# Patient Record
Sex: Male | Born: 1955 | Race: Black or African American | Hispanic: No | Marital: Single | Smoking: Never smoker
Health system: Southern US, Community
[De-identification: ages and names within clinical notes are randomized; demographics above are authoritative.]

## PROBLEM LIST (undated history)

## (undated) DIAGNOSIS — L309 Dermatitis, unspecified: Secondary | ICD-10-CM

## (undated) DIAGNOSIS — E119 Type 2 diabetes mellitus without complications: Secondary | ICD-10-CM

## (undated) DIAGNOSIS — E669 Obesity, unspecified: Secondary | ICD-10-CM

## (undated) DIAGNOSIS — G4733 Obstructive sleep apnea (adult) (pediatric): Secondary | ICD-10-CM

## (undated) DIAGNOSIS — I480 Paroxysmal atrial fibrillation: Secondary | ICD-10-CM

## (undated) DIAGNOSIS — I10 Essential (primary) hypertension: Secondary | ICD-10-CM

## (undated) DIAGNOSIS — I4891 Unspecified atrial fibrillation: Secondary | ICD-10-CM

## (undated) DIAGNOSIS — N529 Male erectile dysfunction, unspecified: Secondary | ICD-10-CM

## (undated) DIAGNOSIS — R001 Bradycardia, unspecified: Secondary | ICD-10-CM

## (undated) DIAGNOSIS — R3129 Other microscopic hematuria: Secondary | ICD-10-CM

## (undated) HISTORY — DX: Other microscopic hematuria: R31.29

## (undated) HISTORY — DX: Essential (primary) hypertension: I10

## (undated) HISTORY — DX: Obesity, unspecified: E66.9

## (undated) HISTORY — DX: Dermatitis, unspecified: L30.9

## (undated) HISTORY — DX: Type 2 diabetes mellitus without complications: E11.9

## (undated) HISTORY — DX: Bradycardia, unspecified: R00.1

## (undated) HISTORY — DX: Obstructive sleep apnea (adult) (pediatric): G47.33

## (undated) HISTORY — DX: Male erectile dysfunction, unspecified: N52.9

## (undated) HISTORY — DX: Paroxysmal atrial fibrillation: I48.0

---
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
## 2003-06-15 ENCOUNTER — Encounter: Payer: Self-pay | Admitting: Cardiology

## 2003-06-15 ENCOUNTER — Encounter: Admission: RE | Admit: 2003-06-15 | Discharge: 2003-06-15 | Payer: Self-pay | Admitting: Cardiology

## 2004-10-12 ENCOUNTER — Ambulatory Visit (HOSPITAL_BASED_OUTPATIENT_CLINIC_OR_DEPARTMENT_OTHER): Admission: RE | Admit: 2004-10-12 | Discharge: 2004-10-12 | Payer: Self-pay | Admitting: Internal Medicine

## 2004-11-02 ENCOUNTER — Ambulatory Visit: Payer: Self-pay | Admitting: Internal Medicine

## 2004-11-30 ENCOUNTER — Ambulatory Visit: Payer: Self-pay | Admitting: Internal Medicine

## 2006-03-14 ENCOUNTER — Inpatient Hospital Stay (HOSPITAL_COMMUNITY): Admission: EM | Admit: 2006-03-14 | Discharge: 2006-03-20 | Payer: Self-pay | Admitting: Emergency Medicine

## 2006-03-14 ENCOUNTER — Ambulatory Visit: Payer: Self-pay | Admitting: Internal Medicine

## 2006-03-14 ENCOUNTER — Encounter (INDEPENDENT_AMBULATORY_CARE_PROVIDER_SITE_OTHER): Payer: Self-pay | Admitting: Cardiovascular Disease

## 2006-03-23 ENCOUNTER — Ambulatory Visit: Payer: Self-pay | Admitting: Cardiology

## 2006-03-29 ENCOUNTER — Ambulatory Visit: Payer: Self-pay | Admitting: Cardiology

## 2006-04-05 ENCOUNTER — Ambulatory Visit: Payer: Self-pay | Admitting: Cardiology

## 2006-04-12 ENCOUNTER — Ambulatory Visit: Payer: Self-pay | Admitting: Cardiology

## 2006-04-16 ENCOUNTER — Ambulatory Visit (HOSPITAL_COMMUNITY): Admission: RE | Admit: 2006-04-16 | Discharge: 2006-04-16 | Payer: Self-pay | Admitting: Internal Medicine

## 2006-04-19 ENCOUNTER — Ambulatory Visit: Payer: Self-pay | Admitting: Cardiology

## 2006-04-23 ENCOUNTER — Ambulatory Visit: Payer: Self-pay | Admitting: Internal Medicine

## 2006-04-23 ENCOUNTER — Ambulatory Visit: Payer: Self-pay | Admitting: Cardiovascular Disease

## 2006-05-04 ENCOUNTER — Ambulatory Visit: Payer: Self-pay | Admitting: Internal Medicine

## 2006-05-04 ENCOUNTER — Ambulatory Visit: Payer: Self-pay

## 2006-05-04 ENCOUNTER — Encounter: Payer: Self-pay | Admitting: Internal Medicine

## 2006-05-25 ENCOUNTER — Ambulatory Visit: Payer: Self-pay | Admitting: Cardiology

## 2006-05-25 ENCOUNTER — Encounter: Payer: Self-pay | Admitting: Cardiology

## 2006-05-25 ENCOUNTER — Ambulatory Visit: Payer: Self-pay

## 2006-06-14 ENCOUNTER — Ambulatory Visit (HOSPITAL_COMMUNITY): Admission: RE | Admit: 2006-06-14 | Discharge: 2006-06-14 | Payer: Self-pay | Admitting: Internal Medicine

## 2006-06-14 ENCOUNTER — Ambulatory Visit: Payer: Self-pay | Admitting: Internal Medicine

## 2006-06-15 ENCOUNTER — Emergency Department (HOSPITAL_COMMUNITY): Admission: EM | Admit: 2006-06-15 | Discharge: 2006-06-16 | Payer: Self-pay | Admitting: Emergency Medicine

## 2006-06-21 ENCOUNTER — Ambulatory Visit: Payer: Self-pay

## 2006-06-21 ENCOUNTER — Ambulatory Visit: Payer: Self-pay | Admitting: Cardiology

## 2006-06-25 ENCOUNTER — Emergency Department (HOSPITAL_COMMUNITY): Admission: EM | Admit: 2006-06-25 | Discharge: 2006-06-25 | Payer: Self-pay | Admitting: Emergency Medicine

## 2006-06-26 ENCOUNTER — Ambulatory Visit (HOSPITAL_COMMUNITY): Admission: RE | Admit: 2006-06-26 | Discharge: 2006-06-26 | Payer: Self-pay | Admitting: Internal Medicine

## 2006-07-12 ENCOUNTER — Ambulatory Visit: Payer: Self-pay | Admitting: Internal Medicine

## 2006-07-19 ENCOUNTER — Ambulatory Visit: Payer: Self-pay | Admitting: Cardiology

## 2006-08-21 ENCOUNTER — Ambulatory Visit: Payer: Self-pay | Admitting: Cardiology

## 2006-10-01 ENCOUNTER — Ambulatory Visit: Payer: Self-pay | Admitting: Cardiology

## 2006-10-11 ENCOUNTER — Ambulatory Visit: Payer: Self-pay | Admitting: Internal Medicine

## 2006-11-06 ENCOUNTER — Ambulatory Visit: Payer: Self-pay | Admitting: Internal Medicine

## 2006-11-08 ENCOUNTER — Ambulatory Visit: Payer: Self-pay | Admitting: *Deleted

## 2006-11-29 ENCOUNTER — Ambulatory Visit: Payer: Self-pay | Admitting: Internal Medicine

## 2006-11-29 ENCOUNTER — Ambulatory Visit: Payer: Self-pay | Admitting: Cardiology

## 2006-12-27 ENCOUNTER — Ambulatory Visit: Payer: Self-pay | Admitting: Cardiology

## 2007-01-10 ENCOUNTER — Ambulatory Visit: Payer: Self-pay | Admitting: Cardiology

## 2007-01-24 ENCOUNTER — Ambulatory Visit: Payer: Self-pay | Admitting: Internal Medicine

## 2007-02-19 ENCOUNTER — Ambulatory Visit: Payer: Self-pay | Admitting: Cardiology

## 2007-03-26 ENCOUNTER — Emergency Department (HOSPITAL_COMMUNITY): Admission: EM | Admit: 2007-03-26 | Discharge: 2007-03-26 | Payer: Self-pay | Admitting: Emergency Medicine

## 2007-03-26 ENCOUNTER — Ambulatory Visit: Payer: Self-pay | Admitting: Cardiology

## 2007-03-26 ENCOUNTER — Ambulatory Visit: Payer: Self-pay | Admitting: Internal Medicine

## 2007-03-27 ENCOUNTER — Emergency Department (HOSPITAL_COMMUNITY): Admission: EM | Admit: 2007-03-27 | Discharge: 2007-03-27 | Payer: Self-pay | Admitting: Family Medicine

## 2007-03-29 ENCOUNTER — Ambulatory Visit: Payer: Self-pay | Admitting: Cardiology

## 2007-03-29 LAB — CONVERTED CEMR LAB
Basophils Relative: 0.7 % (ref 0.0–1.0)
Eosinophils Relative: 0.7 % (ref 0.0–5.0)
HCT: 39.5 % (ref 39.0–52.0)
Hemoglobin: 13 g/dL (ref 13.0–17.0)
Lymphocytes Relative: 20.6 % (ref 12.0–46.0)
Monocytes Absolute: 1.1 10*3/uL — ABNORMAL HIGH (ref 0.2–0.7)
Neutro Abs: 9.7 10*3/uL — ABNORMAL HIGH (ref 1.4–7.7)
Neutrophils Relative %: 70 % (ref 43.0–77.0)
WBC: 13.8 10*3/uL — ABNORMAL HIGH (ref 4.5–10.5)

## 2007-04-04 ENCOUNTER — Emergency Department (HOSPITAL_COMMUNITY): Admission: EM | Admit: 2007-04-04 | Discharge: 2007-04-04 | Payer: Self-pay | Admitting: Emergency Medicine

## 2007-04-17 ENCOUNTER — Ambulatory Visit: Payer: Self-pay | Admitting: Internal Medicine

## 2007-04-17 LAB — CONVERTED CEMR LAB
AST: 22 units/L (ref 0–37)
Hgb A1c MFr Bld: 6.1 % — ABNORMAL HIGH (ref 4.6–6.0)
PSA: 0.79 ng/mL (ref 0.10–4.00)
VLDL: 39 mg/dL (ref 0–40)

## 2007-05-09 ENCOUNTER — Ambulatory Visit: Payer: Self-pay | Admitting: Internal Medicine

## 2007-05-21 ENCOUNTER — Ambulatory Visit: Payer: Self-pay | Admitting: Internal Medicine

## 2007-05-23 ENCOUNTER — Ambulatory Visit: Payer: Self-pay | Admitting: Cardiology

## 2007-07-04 ENCOUNTER — Ambulatory Visit: Payer: Self-pay | Admitting: Internal Medicine

## 2007-07-31 ENCOUNTER — Ambulatory Visit: Payer: Self-pay | Admitting: Internal Medicine

## 2007-10-02 DIAGNOSIS — G4733 Obstructive sleep apnea (adult) (pediatric): Secondary | ICD-10-CM | POA: Insufficient documentation

## 2007-10-02 DIAGNOSIS — I10 Essential (primary) hypertension: Secondary | ICD-10-CM | POA: Insufficient documentation

## 2007-10-02 DIAGNOSIS — I4891 Unspecified atrial fibrillation: Secondary | ICD-10-CM | POA: Insufficient documentation

## 2007-11-20 ENCOUNTER — Ambulatory Visit: Payer: Self-pay | Admitting: Cardiology

## 2007-12-11 ENCOUNTER — Ambulatory Visit: Payer: Self-pay | Admitting: Cardiology

## 2007-12-12 ENCOUNTER — Encounter: Payer: Self-pay | Admitting: Internal Medicine

## 2008-01-02 ENCOUNTER — Encounter: Payer: Self-pay | Admitting: Internal Medicine

## 2008-02-11 ENCOUNTER — Ambulatory Visit: Payer: Self-pay | Admitting: Cardiology

## 2008-02-24 ENCOUNTER — Ambulatory Visit: Payer: Self-pay | Admitting: Internal Medicine

## 2008-02-24 DIAGNOSIS — H9190 Unspecified hearing loss, unspecified ear: Secondary | ICD-10-CM | POA: Insufficient documentation

## 2008-02-24 DIAGNOSIS — L259 Unspecified contact dermatitis, unspecified cause: Secondary | ICD-10-CM | POA: Insufficient documentation

## 2008-03-03 ENCOUNTER — Encounter: Payer: Self-pay | Admitting: Internal Medicine

## 2008-05-26 ENCOUNTER — Ambulatory Visit: Payer: Self-pay | Admitting: Internal Medicine

## 2008-05-26 LAB — CONVERTED CEMR LAB
ALT: 22 units/L (ref 0–53)
AST: 23 units/L (ref 0–37)
Bilirubin, Direct: 0.1 mg/dL (ref 0.0–0.3)
CO2: 27 meq/L (ref 19–32)
Calcium: 8.7 mg/dL (ref 8.4–10.5)
Chloride: 105 meq/L (ref 96–112)
GFR calc non Af Amer: 74 mL/min
LDL Cholesterol: 104 mg/dL — ABNORMAL HIGH (ref 0–99)
Sodium: 139 meq/L (ref 135–145)
TSH: 1.72 microintl units/mL (ref 0.35–5.50)
Total Bilirubin: 0.7 mg/dL (ref 0.3–1.2)
Total CHOL/HDL Ratio: 5

## 2008-06-02 ENCOUNTER — Ambulatory Visit: Payer: Self-pay | Admitting: Internal Medicine

## 2008-06-02 DIAGNOSIS — R7309 Other abnormal glucose: Secondary | ICD-10-CM

## 2008-09-08 ENCOUNTER — Encounter: Payer: Self-pay | Admitting: Internal Medicine

## 2008-10-15 ENCOUNTER — Ambulatory Visit: Payer: Self-pay | Admitting: Internal Medicine

## 2008-10-19 ENCOUNTER — Inpatient Hospital Stay: Payer: Self-pay | Admitting: Internal Medicine

## 2008-10-19 ENCOUNTER — Ambulatory Visit: Payer: Self-pay | Admitting: Cardiology

## 2008-10-26 ENCOUNTER — Ambulatory Visit: Payer: Self-pay | Admitting: Cardiology

## 2008-10-27 ENCOUNTER — Ambulatory Visit: Payer: Self-pay | Admitting: Cardiology

## 2008-10-29 ENCOUNTER — Ambulatory Visit: Payer: Self-pay | Admitting: Diagnostic Radiology

## 2008-10-29 ENCOUNTER — Ambulatory Visit (HOSPITAL_BASED_OUTPATIENT_CLINIC_OR_DEPARTMENT_OTHER): Admission: RE | Admit: 2008-10-29 | Discharge: 2008-10-29 | Payer: Self-pay | Admitting: Internal Medicine

## 2008-10-29 ENCOUNTER — Ambulatory Visit: Payer: Self-pay | Admitting: Internal Medicine

## 2008-10-29 ENCOUNTER — Telehealth (INDEPENDENT_AMBULATORY_CARE_PROVIDER_SITE_OTHER): Payer: Self-pay | Admitting: *Deleted

## 2008-10-29 DIAGNOSIS — R3129 Other microscopic hematuria: Secondary | ICD-10-CM | POA: Insufficient documentation

## 2008-10-29 DIAGNOSIS — R1012 Left upper quadrant pain: Secondary | ICD-10-CM

## 2008-10-29 DIAGNOSIS — R0789 Other chest pain: Secondary | ICD-10-CM

## 2008-10-29 LAB — CONVERTED CEMR LAB
Bacteria, UA: NEGATIVE
Bilirubin Urine: NEGATIVE
Bilirubin Urine: NEGATIVE
Crystals: NEGATIVE
Glucose, Urine, Semiquant: NEGATIVE
Ketones, ur: NEGATIVE mg/dL
Ketones, urine, test strip: NEGATIVE
Leukocytes, UA: NEGATIVE
Mucus, UA: NEGATIVE
Nitrite: NEGATIVE
Nitrite: NEGATIVE
Protein, U semiquant: 30
Specific Gravity, Urine: 1.015
Specific Gravity, Urine: 1.015 (ref 1.000–1.03)
Total Protein, Urine: NEGATIVE mg/dL
Urine Glucose: NEGATIVE mg/dL
Urobilinogen, UA: 0.2
Urobilinogen, UA: 0.2 (ref 0.0–1.0)
WBC Urine, dipstick: NEGATIVE
pH: 6
pH: 7 (ref 5.0–8.0)

## 2008-10-30 ENCOUNTER — Ambulatory Visit: Payer: Self-pay | Admitting: Cardiology

## 2008-11-02 ENCOUNTER — Telehealth: Payer: Self-pay | Admitting: Internal Medicine

## 2008-11-12 ENCOUNTER — Ambulatory Visit: Payer: Self-pay | Admitting: Cardiology

## 2008-11-13 ENCOUNTER — Ambulatory Visit: Payer: Self-pay | Admitting: Internal Medicine

## 2008-11-25 ENCOUNTER — Encounter: Payer: Self-pay | Admitting: Pulmonary Disease

## 2008-12-09 ENCOUNTER — Encounter: Payer: Self-pay | Admitting: Cardiology

## 2008-12-09 ENCOUNTER — Ambulatory Visit: Payer: Self-pay | Admitting: Internal Medicine

## 2008-12-09 LAB — CONVERTED CEMR LAB
ALT: 23 units/L (ref 0–53)
Alkaline Phosphatase: 44 units/L (ref 39–117)
Bilirubin, Direct: 0.1 mg/dL (ref 0.0–0.3)
Cholesterol: 189 mg/dL (ref 0–200)
Indirect Bilirubin: 0.3 mg/dL (ref 0.0–0.9)
Total Protein: 6.9 g/dL (ref 6.0–8.3)
Triglycerides: 129 mg/dL (ref <150)

## 2008-12-16 ENCOUNTER — Ambulatory Visit: Payer: Self-pay | Admitting: Internal Medicine

## 2008-12-30 ENCOUNTER — Ambulatory Visit: Payer: Self-pay | Admitting: Internal Medicine

## 2009-02-03 ENCOUNTER — Ambulatory Visit: Payer: Self-pay | Admitting: Internal Medicine

## 2009-03-09 ENCOUNTER — Ambulatory Visit: Payer: Self-pay | Admitting: Cardiovascular Disease

## 2009-03-23 ENCOUNTER — Encounter: Payer: Self-pay | Admitting: *Deleted

## 2009-04-24 ENCOUNTER — Emergency Department: Payer: Self-pay | Admitting: Emergency Medicine

## 2009-04-28 ENCOUNTER — Encounter: Payer: Self-pay | Admitting: Cardiology

## 2009-04-28 ENCOUNTER — Ambulatory Visit: Payer: Self-pay | Admitting: Cardiology

## 2009-04-28 ENCOUNTER — Encounter: Payer: Self-pay | Admitting: *Deleted

## 2009-04-28 DIAGNOSIS — E785 Hyperlipidemia, unspecified: Secondary | ICD-10-CM

## 2009-05-26 ENCOUNTER — Ambulatory Visit: Payer: Self-pay | Admitting: Cardiology

## 2009-06-02 LAB — CONVERTED CEMR LAB
ALT: 18 units/L (ref 0–53)
AST: 19 units/L (ref 0–37)
Albumin: 3.9 g/dL (ref 3.5–5.2)
Bilirubin, Direct: 0.1 mg/dL (ref 0.0–0.3)
HDL: 43 mg/dL (ref >39)
INR: 2.8 — ABNORMAL HIGH (ref 0.0–1.5)
Total Bilirubin: 0.4 mg/dL (ref 0.3–1.2)
Total CHOL/HDL Ratio: 3.3
VLDL: 27 mg/dL (ref 0–40)

## 2009-06-07 ENCOUNTER — Encounter: Payer: Self-pay | Admitting: *Deleted

## 2009-06-15 ENCOUNTER — Telehealth: Payer: Self-pay | Admitting: Cardiology

## 2009-07-19 ENCOUNTER — Encounter (INDEPENDENT_AMBULATORY_CARE_PROVIDER_SITE_OTHER): Payer: Self-pay | Admitting: *Deleted

## 2009-07-29 ENCOUNTER — Telehealth: Payer: Self-pay | Admitting: Internal Medicine

## 2009-08-02 ENCOUNTER — Telehealth: Payer: Self-pay | Admitting: Internal Medicine

## 2009-08-05 ENCOUNTER — Ambulatory Visit: Payer: Self-pay | Admitting: Cardiology

## 2009-08-05 LAB — CONVERTED CEMR LAB: POC INR: 3.1

## 2009-08-10 ENCOUNTER — Ambulatory Visit: Payer: Self-pay | Admitting: Internal Medicine

## 2009-08-10 LAB — CONVERTED CEMR LAB
CO2: 30 meq/L (ref 19–32)
Calcium: 9.1 mg/dL (ref 8.4–10.5)
Chloride: 106 meq/L (ref 96–112)
Creatinine, Ser: 1.1 mg/dL (ref 0.4–1.5)
Hgb A1c MFr Bld: 6.1 % (ref 4.6–6.5)
Sodium: 142 meq/L (ref 135–145)

## 2009-08-12 ENCOUNTER — Ambulatory Visit: Payer: Self-pay | Admitting: Internal Medicine

## 2009-08-30 ENCOUNTER — Telehealth: Payer: Self-pay | Admitting: Cardiology

## 2009-09-06 ENCOUNTER — Telehealth (INDEPENDENT_AMBULATORY_CARE_PROVIDER_SITE_OTHER): Payer: Self-pay | Admitting: *Deleted

## 2009-09-22 ENCOUNTER — Encounter (INDEPENDENT_AMBULATORY_CARE_PROVIDER_SITE_OTHER): Payer: Self-pay | Admitting: *Deleted

## 2009-11-30 ENCOUNTER — Ambulatory Visit: Payer: Self-pay | Admitting: Cardiology

## 2009-11-30 LAB — CONVERTED CEMR LAB: POC INR: 1.9

## 2009-12-01 ENCOUNTER — Telehealth: Payer: Self-pay | Admitting: Internal Medicine

## 2009-12-03 ENCOUNTER — Encounter (INDEPENDENT_AMBULATORY_CARE_PROVIDER_SITE_OTHER): Payer: Self-pay | Admitting: *Deleted

## 2009-12-10 ENCOUNTER — Encounter (INDEPENDENT_AMBULATORY_CARE_PROVIDER_SITE_OTHER): Payer: Self-pay | Admitting: *Deleted

## 2009-12-10 ENCOUNTER — Ambulatory Visit: Payer: Self-pay | Admitting: Gastroenterology

## 2010-01-13 ENCOUNTER — Telehealth: Payer: Self-pay | Admitting: Cardiology

## 2010-01-18 ENCOUNTER — Telehealth: Payer: Self-pay | Admitting: Gastroenterology

## 2010-01-31 ENCOUNTER — Telehealth: Payer: Self-pay | Admitting: Internal Medicine

## 2010-02-18 IMAGING — CR DG CHEST 1V PORT
1 series · 1 of 1 positions shown · non-contrast
Comparison: none

REASON FOR EXAM: Chest Pain
COMMENTS:

PROCEDURE:     DXR - DXR PORTABLE CHEST SINGLE VIEW  - April 24, 2009  [DATE]
RESULT:     No acute cardiopulmonary disease identified. Cardiomegaly is
present. Chest is stable from 10-19-2008.

[view not recorded]
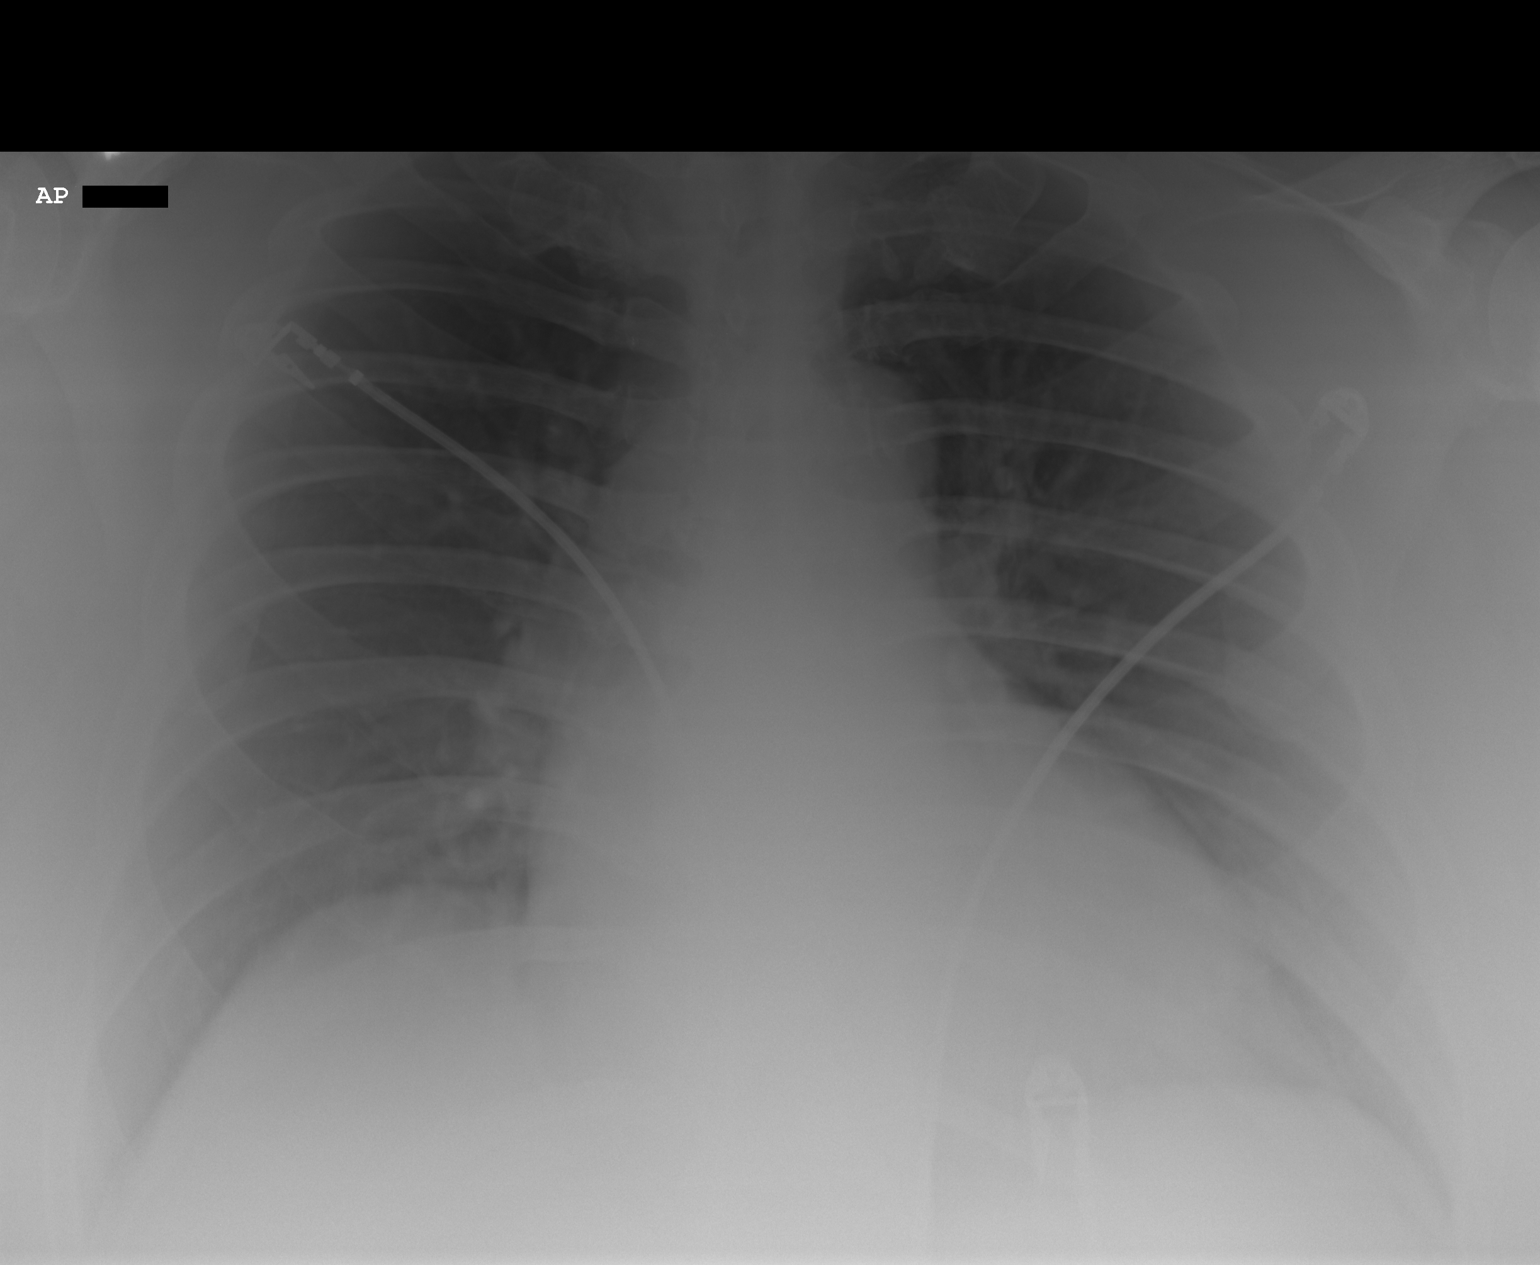

[1 of 1 positions shown; findings below may reference images not displayed]

IMPRESSION: 1. Stable chest from 10-19-2008 with stable cardiomegaly.

## 2010-03-15 ENCOUNTER — Telehealth: Payer: Self-pay | Admitting: Cardiovascular Disease

## 2010-03-17 ENCOUNTER — Ambulatory Visit: Payer: Self-pay | Admitting: Cardiovascular Disease

## 2010-03-17 LAB — CONVERTED CEMR LAB: POC INR: 1.7

## 2010-03-25 ENCOUNTER — Ambulatory Visit: Payer: Self-pay | Admitting: Cardiovascular Disease

## 2010-04-13 ENCOUNTER — Telehealth: Payer: Self-pay | Admitting: Cardiovascular Disease

## 2010-04-16 ENCOUNTER — Ambulatory Visit: Payer: Self-pay | Admitting: Internal Medicine

## 2010-04-16 ENCOUNTER — Inpatient Hospital Stay: Payer: Self-pay | Admitting: Internal Medicine

## 2010-04-18 ENCOUNTER — Encounter: Payer: Self-pay | Admitting: Internal Medicine

## 2010-04-20 ENCOUNTER — Ambulatory Visit: Payer: Self-pay | Admitting: Cardiovascular Disease

## 2010-04-21 ENCOUNTER — Telehealth: Payer: Self-pay | Admitting: Internal Medicine

## 2010-04-22 ENCOUNTER — Ambulatory Visit (HOSPITAL_COMMUNITY): Admission: RE | Admit: 2010-04-22 | Discharge: 2010-04-22 | Payer: Self-pay | Admitting: Internal Medicine

## 2010-04-22 ENCOUNTER — Ambulatory Visit: Payer: Self-pay | Admitting: Cardiology

## 2010-04-28 ENCOUNTER — Telehealth: Payer: Self-pay | Admitting: Cardiovascular Disease

## 2010-04-29 ENCOUNTER — Ambulatory Visit: Payer: Self-pay | Admitting: Internal Medicine

## 2010-04-29 ENCOUNTER — Encounter: Payer: Self-pay | Admitting: Cardiovascular Disease

## 2010-05-04 ENCOUNTER — Encounter: Payer: Self-pay | Admitting: Cardiovascular Disease

## 2010-05-04 ENCOUNTER — Telehealth: Payer: Self-pay | Admitting: Cardiovascular Disease

## 2010-05-18 ENCOUNTER — Encounter (INDEPENDENT_AMBULATORY_CARE_PROVIDER_SITE_OTHER): Payer: Self-pay | Admitting: Pharmacist

## 2010-05-18 ENCOUNTER — Telehealth (INDEPENDENT_AMBULATORY_CARE_PROVIDER_SITE_OTHER): Payer: Self-pay

## 2010-06-01 ENCOUNTER — Telehealth (INDEPENDENT_AMBULATORY_CARE_PROVIDER_SITE_OTHER): Payer: Self-pay

## 2010-06-15 ENCOUNTER — Encounter: Payer: Self-pay | Admitting: Cardiovascular Disease

## 2010-06-15 ENCOUNTER — Ambulatory Visit: Payer: Self-pay | Admitting: Internal Medicine

## 2010-06-15 LAB — CONVERTED CEMR LAB
BUN: 11 mg/dL (ref 6–23)
CO2: 23 meq/L (ref 19–32)
Glucose, Bld: 95 mg/dL (ref 70–99)
Potassium: 4 meq/L (ref 3.5–5.3)
Sodium: 140 meq/L (ref 135–145)

## 2010-06-16 ENCOUNTER — Encounter: Payer: Self-pay | Admitting: Cardiovascular Disease

## 2010-06-16 ENCOUNTER — Encounter: Payer: Self-pay | Admitting: Internal Medicine

## 2010-06-17 ENCOUNTER — Encounter: Payer: Self-pay | Admitting: Internal Medicine

## 2010-07-03 ENCOUNTER — Emergency Department: Payer: Self-pay | Admitting: Emergency Medicine

## 2010-08-15 ENCOUNTER — Telehealth: Payer: Self-pay | Admitting: Cardiology

## 2010-08-31 ENCOUNTER — Telehealth: Payer: Self-pay | Admitting: Internal Medicine

## 2010-09-19 ENCOUNTER — Telehealth: Payer: Self-pay | Admitting: Internal Medicine

## 2010-10-12 ENCOUNTER — Telehealth: Payer: Self-pay | Admitting: Cardiovascular Disease

## 2010-10-13 ENCOUNTER — Ambulatory Visit: Payer: Self-pay | Admitting: Cardiovascular Disease

## 2010-10-13 LAB — CONVERTED CEMR LAB: POC INR: 1.9

## 2010-11-01 ENCOUNTER — Telehealth: Payer: Self-pay | Admitting: Cardiology

## 2010-11-01 ENCOUNTER — Encounter: Payer: Self-pay | Admitting: Cardiovascular Disease

## 2010-11-01 ENCOUNTER — Ambulatory Visit
Admission: RE | Admit: 2010-11-01 | Discharge: 2010-11-01 | Payer: Self-pay | Source: Home / Self Care | Attending: Cardiovascular Disease | Admitting: Cardiovascular Disease

## 2010-11-09 ENCOUNTER — Ambulatory Visit: Admit: 2010-11-09 | Payer: Self-pay

## 2010-11-13 ENCOUNTER — Encounter: Payer: Self-pay | Admitting: Internal Medicine

## 2010-11-22 NOTE — Letter (Signed)
Summary: Anticoagulation Modification Letter  [facility]  [address] [place], [place] [address]   Phone: [phone]  Fax: [phone]    December 10, 2009  Re:    Steven Beltran DOB:    06-10-1956 MRN:    [id number]    Dear Dr. Graciela Husbands,   We have scheduled the above patient for an endoscopic procedure. Our records show that  he/she is on anticoagulation therapy. Please advise as to how long the patient may come off their therapy of coumadin prior to the scheduled procedure(s) on 02/04/10.   Please fax back/or route the completed form to [place] at [phone].  Thank you for your help with this matter.  Sincerely,  Christie Nottingham CMA Duncan Dull)   Physician Recommendation:  Hold Plavix 7 days prior ________________  Hold Coumadin 5 days prior ____________  Other ______________________________     Appended Document: Anticoagulation Modification Letter yes  Appended Document: Anticoagulation Modification Letter Left a mesage stating Dr. Graciela Husbands said it was ok for pt to come off coumdain 5 days before his procedure and if he has any questions to call back and ask to speak to me.

## 2010-11-22 NOTE — Assessment & Plan Note (Signed)
Summary: TB Skin Test Recheck  Nurse Visit   Primary Care Steven Beltran:  [name] DO  CC:  TB skin test recheck.  History of Present Illness:  The patient presented after  48 hours to check the injection site for positive or negative reaction  Injection site examination No firm bump forms at the test site. Slight reddish appearance  and diameter was smaller than 5mm  Assesment & Plan Negative TB skin test. Patient was counseled to call is expeience any irritation of site   [name] [facility]  June 17, 2010 10:51 AM    Allergies: No Known Drug Allergies

## 2010-11-22 NOTE — Letter (Signed)
Summary: [facility] Instructions  [facility]  [address] [place], [place] [address]   Phone: [phone]  Fax: [phone]       Steven Beltran    Sep 15, 1956    MRN: [id number]        Procedure Day /Date: Tuesday April 5th, 2011     Arrival Time: 7:30am     Procedure Time: 8:30am     Location of Procedure:                     _x_  [facility] ( Outpatient Registration)                        PREPARATION FOR COLONOSCOPY WITH MOVIPREP   Starting 5 days prior to your procedure 01/20/10  do not eat nuts, seeds, popcorn, corn, beans, peas,  salads, or any raw vegetables.  Do not take any fiber supplements (e.g. Metamucil, Citrucel, and Benefiber).  THE DAY BEFORE YOUR PROCEDURE         DATE:  01/24/10   DAY:  Monday   1.  Drink clear liquids the entire day-NO SOLID FOOD  2.  Do not drink anything colored red or purple.  Avoid juices with pulp.  No orange juice.  3.  Drink at least 64 oz. (8 glasses) of fluid/clear liquids during the day to prevent dehydration and help the prep work efficiently.  CLEAR LIQUIDS INCLUDE: Water Jello Ice Popsicles Tea (sugar ok, no milk/cream) Powdered fruit flavored drinks Coffee (sugar ok, no milk/cream) Gatorade Juice: apple, white grape, white cranberry  Lemonade Clear bullion, consomm, broth Carbonated beverages (any kind) Strained chicken noodle soup Hard Candy                             4.  In the morning, mix first dose of MoviPrep solution:    Empty 1 Pouch A and 1 Pouch B into the disposable container    Add lukewarm drinking water to the top line of the container. Mix to dissolve    Refrigerate (mixed solution should be used within 24 hrs)  5.  Begin drinking the prep at 5:00 p.m. The MoviPrep container is divided by 4 marks.   Every 15 minutes drink the solution down to the next mark (approximately 8 oz) until the full liter is complete.   6.  Follow completed prep with 16 oz of clear liquid of  your choice (Nothing red or purple).  Continue to drink clear liquids until bedtime.  7.  Before going to bed, mix second dose of MoviPrep solution:    Empty 1 Pouch A and 1 Pouch B into the disposable container    Add lukewarm drinking water to the top line of the container. Mix to dissolve    Refrigerate  THE DAY OF YOUR PROCEDURE      DATE:  01/25/10  DAY:  Tuesday  Beginning at  3:30a.m. (5 hours before procedure):         1. Every 15 minutes, drink the solution down to the next mark (approx 8 oz) until the full liter is complete.  2. Follow completed prep with 16 oz. of clear liquid of your choice.    3. You may drink clear liquids until  4:30am  (4 HOURS BEFORE PROCEDURE).   MEDICATION INSTRUCTIONS  Unless otherwise instructed, you should take regular prescription medications with a small sip of  water   as early as possible the morning of your procedure.  Diabetic patients - see separate instructions.  Stop taking Plavix or Aggrenox on  _  _  (7 days before procedure).     Stop taking Coumadin on  _ _  (5 days before procedure).  Additional medication instructions: You will be contaced by our office prior to your procedure for directions on holding your Coumadin/Warfarin.  If you do not hear from our office 1 week prior to your scheduled procedure, please call [phone] to discuss.          OTHER INSTRUCTIONS  You will need a responsible adult at least 56 years of age to accompany you and drive you home.   This person must remain in the waiting room during your procedure.  Wear loose fitting clothing that is easily removed.  Leave jewelry and other valuables at home.  However, you may wish to bring a book to read or  an iPod/MP3 player to listen to music as you wait for your procedure to start.  Remove all body piercing jewelry and leave at home.  Total time from sign-in until discharge is approximately 2-3 hours.  You should go home directly after your  procedure and rest.  You can resume normal activities the  day after your procedure.  The day of your procedure you should not:   Drive   Make legal decisions   Operate machinery   Drink alcohol   Return to work  You will receive specific instructions about eating, activities and medications before you leave.    The above instructions have been reviewed and explained to me by   Marchelle Folks.    I fully understand and can verbalize these instructions _____________________________ Date _________

## 2010-11-22 NOTE — Letter (Signed)
Summary: New Patient letter  [facility]  [address] [place], [place] [address]   Phone: [phone]  Fax: [phone]       12/03/2009 MRN: [id number]  LACY TAGLIERI [address] [place], [place]  [address]  Dear Mr. WINKELS,  Welcome to the Gastroenterology Division at [facility].    You are scheduled to see Dr.  [name] on 12-10-09 at 8:45AM on the 3rd floor at [facility], [address]  We ask that you try to arrive at our office 15 minutes prior to your appointment time to allow for check-in.  We would like you to complete the enclosed self-administered evaluation form prior to your visit and bring it with you on the day of your appointment.  We will review it with you.  Also, please bring a complete list of all your medications or, if you prefer, bring the medication bottles and we will list them.  Please bring your insurance card so that we may make a copy of it.  If your insurance requires a referral to see a specialist, please bring your referral form from your primary care physician.  Co-payments are due at the time of your visit and may be paid by cash, check or credit card.     Your office visit will consist of a consult with your physician (includes a physical exam), any laboratory testing he/she may order, scheduling of any necessary diagnostic testing (e.g. x-ray, ultrasound, CT-scan), and scheduling of a procedure (e.g. Endoscopy, Colonoscopy) if required.  Please allow enough time on your schedule to allow for any/all of these possibilities.    If you cannot keep your appointment, please call [phone] to cancel or reschedule prior to your appointment date.  This allows [place] the opportunity to schedule an appointment for another patient in need of care.  If you do not cancel or reschedule by 5 p.m. the business day prior to your appointment date, you will be charged a $50.00 late cancellation/no-show fee.    Thank you for choosing [facility] for your medical needs.  We appreciate the opportunity to care for you.  Please visit [place] at our website  to learn more about our practice.                     Sincerely,                                                             The Gastroenterology Division

## 2010-11-22 NOTE — Letter (Signed)
Summary: Cardiac Catheterization Instructions- Main Lab  [facility] at [address]   [place], [place] [address]   Phone: [phone]  Fax: [phone]     04/18/2010 MRN: [id number]  Steven Beltran [address] [place], [place]  [address]  Dear Mr. Steven, Beltran are scheduled for Cardiac Catheterization on April 22, 2010 at 7:30 with Dr.Bensimhon.  Please arrive at the [facility] at 5:30       a.m.on the day of your procedure.  1. DIET     x Nothing to eat or drink after midnight except your medications with a sip of water.  2. Come to the [facility] office on April 20, 2010 @ 8:30 for lab work.    3. MAKE SURE YOU TAKE YOUR ASPIRIN.  4. __x___ DO NOT TAKE these medications before your procedure:    Glucophage    Coumadin     __x__ YOU MAY TAKE ALL of your remaining medications with a small amount of water.  5. Plan for one night stay - bring personal belongings (i.e. toothpaste, toothbrush, etc.)  6. Bring a current list of your medications and current insurance cards.  7. Must have a responsible person to drive you home.   8. Someone must be with you for the first 24 hours after you arrive home.  9. Please wear clothes that are easy to get on and off and wear slip-on shoes.  *Special note: Every effort is made to have your procedure done on time.  Occasionally there are emergencies that present themselves at the hospital that may cause delays.  Please be patient if a delay does occur.  If you have any questions after you get home, please call the office at the number listed above.  Benedict Needy, RN

## 2010-11-22 NOTE — Procedures (Signed)
Summary: Prep/[facility]  Prep/[facility]   Imported By: Lester [place] 12/15/2009 09:11:28  _____________________________________________________________________  External Attachment:    Type:   Image     Comment:   External Document

## 2010-11-22 NOTE — Letter (Signed)
Summary: Custom - Delinquent Coumadin 1  [facility] at [address]   [place], [place] [address]   Phone: [phone]  Fax: [phone]     May 18, 2010 MRN: [id number]   DICKEY CAAMANO [address] [place], [place]  [address]   Dear Mr. KRONICK,  This letter is being sent to you as a reminder that it is necessary for you to get your INR/PT checked regularly so that we can optimize your care.  Our records indicate that you were scheduled to have a test done recently.  As of today, we have not received the results of this test.  It is very important that you have your INR checked.  Please call our office at the number listed above to schedule an appointment at your earliest convenience.    If you have recently had your protime checked or have discontinued this medication, please contact our office at the above phone number to clarify this issue.  Thank you for this prompt attention to this important health care matter.  Sincerely,   [facility] Cardiovascular [facility] Reduction [facility] Team

## 2010-11-22 NOTE — Medication Information (Signed)
Summary: Coumadin Clinic  Anticoagulant Therapy  Managed by: Cloyde Reams, RN, BSN Referring MD: Sherryl Manges MD PCP: Dondra Spry DO Supervising MD: Mariah Milling Indication 1: Atrial Fibrillation (ICD-427.31) Lab Used: [facility] Site: [place] [facility] 24.4 INR POC 2.18 INR RANGE 2 - 3  Dietary changes: no    Health status changes: no    Bleeding/hemorrhagic complications: no    Recent/future hospitalizations: no    Any changes in medication regimen? no    Recent/future dental: no  Any missed doses?: yes     Details: Missed 1 dosage 06/07/10.   Is patient compliant with meds? yes       Allergies: No Known Drug Allergies  Anticoagulation Management History:      His anticoagulation is being managed by telephone today.  Positive risk factors for bleeding include presence of serious comorbidities.  Negative risk factors for bleeding include an age less than 23 years old.  The bleeding index is 'intermediate risk'.  Positive CHADS2 values include History of HTN and History of Diabetes.  Negative CHADS2 values include Age > 58 years old.  The start date was 03/20/2006.  His last INR was 2.18.  Prothrombin time is 24.4.  Anticoagulation responsible provider: gollan.  INR POC: 2.18.  Exp: 05/2011.    Anticoagulation Management Assessment/Plan:      The patient's current anticoagulation dose is Warfarin sodium 10 mg tabs: 1 by mouth once daily as directed by coumadin clinic.  The target INR is 2 - 3.  The next INR is due 07/13/2010.  Anticoagulation instructions were given to patient.  Results were reviewed/authorized by Cloyde Reams, RN, BSN.  He was notified by Cloyde Reams RN.         Prior Anticoagulation Instructions: INR 1.7   Take 2 tablets today, then resume on same dosage 10mg  daily.  Recheck in 1 week at MD visit.    Current Anticoagulation Instructions: INR 2.18  Attempted to call pt with results.  LMOM TCB. Cloyde Reams RN  June 16, 2010 3:44 PM  Spoke with pt advised to continue on same dosage 1 tablet daily.  Recheck in 4 weeks.

## 2010-11-22 NOTE — Medication Information (Signed)
Summary: CC   Anticoagulant Therapy  Managed by: Charlena Cross, RN, BSN Referring MD: Sherryl Manges MD PCP: Dondra Spry DO Supervising MD: Mariah Milling Indication 1: Atrial Fibrillation (ICD-427.31) Lab Used: [facility] Site:  INR POC 1.9 INR RANGE 2 - 3  Dietary changes: no    Health status changes: no    Bleeding/hemorrhagic complications: no    Recent/future hospitalizations: no    Any changes in medication regimen? no    Recent/future dental: no  Any missed doses?: no       Is patient compliant with meds? yes       Allergies: No Known Drug Allergies  Anticoagulation Management History:      The patient is taking warfarin and comes in today for a routine follow up visit.  Negative risk factors for bleeding include an age less than 83 years old.  The bleeding index is 'low risk'.  Positive CHADS2 values include History of HTN.  Negative CHADS2 values include Age > 94 years old.  The start date was 03/20/2006.  His last INR was 2.8.  Anticoagulation responsible provider: Kathleena Freeman.  INR POC: 1.9.    Anticoagulation Management Assessment/Plan:      The patient's current anticoagulation dose is Warfarin sodium 10 mg tabs: 1 by mouth once daily as directed by coumadin clinic.  The target INR is 2 - 3.  The next INR is due 12/28/2009.  Anticoagulation instructions were given to patient.  Results were reviewed/authorized by Charlena Cross, RN, BSN.  He was notified by Charlena Cross, RN, BSN.         Prior Anticoagulation Instructions: The patient is to continue with the same dose of coumadin.  This dosage includes: coumadin 10 mg daily  Current Anticoagulation Instructions: coumadin 15 mg today then resume 10 mg daily

## 2010-11-22 NOTE — Medication Information (Signed)
Summary: CCR/AMD   Anticoagulant Therapy  Managed by: Cloyde Reams, RN, BSN Referring MD: Sherryl Manges MD PCP: Dondra Spry DO Supervising MD: Mariah Milling Indication 1: Atrial Fibrillation (ICD-427.31) Lab Used: [facility] Site: [place] INR POC 1.7 INR RANGE 2 - 3    Bleeding/hemorrhagic complications: no     Any changes in medication regimen? no     Any missed doses?: yes     Details: may have missed a couple of doses, unsure when.    Allergies: No Known Drug Allergies  Anticoagulation Management History:      The patient is taking warfarin and comes in today for a routine follow up visit.  Positive risk factors for bleeding include presence of serious comorbidities.  Negative risk factors for bleeding include an age less than 40 years old.  The bleeding index is 'intermediate risk'.  Positive CHADS2 values include History of HTN and History of Diabetes.  Negative CHADS2 values include Age > 42 years old.  The start date was 03/20/2006.  His last INR was 2.8.  Anticoagulation responsible provider: gollan.  INR POC: 1.7.  Cuvette Lot#: [id number].  Exp: 05/2011.    Anticoagulation Management Assessment/Plan:      The patient's current anticoagulation dose is Warfarin sodium 10 mg tabs: 1 by mouth once daily as directed by coumadin clinic.  The target INR is 2 - 3.  The next INR is due 03/25/2010.  Anticoagulation instructions were given to patient.  Results were reviewed/authorized by Cloyde Reams, RN, BSN.  He was notified by Cloyde Reams, RN, BSN.         Prior Anticoagulation Instructions: coumadin 15 mg today then resume 10 mg daily  Current Anticoagulation Instructions: INR 1.7   Take 2 tablets today, then resume on same dosage 10mg  daily.  Recheck in 1 week at MD visit.   Prescriptions: WARFARIN SODIUM 10 MG TABS (WARFARIN SODIUM) 1 by mouth once daily as directed by coumadin clinic  #35 x 1   Entered by:   Hardin Negus,  RMA   Authorized by:   Dossie Arbour MD   Signed by:   Hardin Negus, RMA on 03/17/2010   Method used:   Electronically to        [id number] (retail)       [address]       [place], [place]  [address]       Ph: [phone]       Fax: [phone]   RxID:   [id number]

## 2010-11-22 NOTE — Assessment & Plan Note (Signed)
Summary: SCREEN FOR COLON/YF   History of Present Illness Visit Type: Initial Consult Primary GI MD: Elie Goody MD [facility] Primary Provider: Dondra Spry DO Requesting Provider: Dondra Spry DO Chief Complaint: Screening for colonoscopy  on Warfarin History of Present Illness:   Steven Beltran is a 55 year old male referred for colorectal cancer screening. He is paroxysmal atrial fibrillation and is maintained on Coumadin, obstructive sleep apnea using CPAP hypertension, diabetes mellitus, and obesity. He has no colorectal complaints there is no family history of colon cancer or colon polyps.   GI Review of Systems      Denies abdominal pain, acid reflux, belching, bloating, chest pain, dysphagia with liquids, dysphagia with solids, heartburn, loss of appetite, nausea, vomiting, vomiting blood, weight loss, and  weight gain.        Denies anal fissure, black tarry stools, change in bowel habit, constipation, diarrhea, diverticulosis, fecal incontinence, heme positive stool, hemorrhoids, irritable bowel syndrome, jaundice, light color stool, liver problems, rectal bleeding, and  rectal pain.   Current Medications (verified): 1)  Warfarin Sodium 10 Mg Tabs (Warfarin Sodium) .Marland Kitchen.. 1 By Mouth Once Daily As Directed By Coumadin Clinic 2)  Flecainide Acetate 100 Mg Tabs (Flecainide Acetate) .... Take One Tablet By Mouth Every Morning and 1/2 Tab By Mouth Every Evening 3)  Metoprolol Succinate 25 Mg  Tb24 (Metoprolol Succinate) .... Take 1 Tablet By Mouth Once Daily 4)  Glucophage Xr 500 Mg  Xr24h-Tab (Metformin Hcl) .... One By Mouth Two Times A Day Before Meals 5)  Freestyle Lite Test   Strp (Glucose Blood) .... Test Blood Sugar As Directed 6)  Cozaar 25 Mg Tabs (Losartan Potassium) .... Take One Tablet By Mouth Daily 7)  Lipitor 10 Mg Tabs (Atorvastatin Calcium) .... Take One Tablet By Mouth Daily. 8)  Cialis 5 Mg Tabs (Tadalafil) .Marland Kitchen.. 1 Tab By Mouth 30 Minutes Prior To Activity  Allergies  (verified): No Known Drug Allergies  Past History:  Past Medical History: Reviewed history from 08/12/2009 and no changes required. 1. Hypertension. 2. Paroxysmal atrial fibrillation.  The patient was cardioverted back     in 2008.  He has been maintained on flecainide and Lopressor. 3. Type 2 diabetes. 4. Obesity.  5. Obstructive sleep apnea.  Using CPAP more.  6. Exercise treadmill Myoview, January 2010.  The patient exercised     for 5 minutes, had a hypertensive blood pressure response.  EKG was     uninterpretable due to artifact.  There is a large fixed inferior     defect with no reversibility.  EF was 54%.  It was thought that     this inferior defect maybe due to diaphragmatic attenuation given     the patient's large size. 7. Echocardiogram.  This was a very limited technically difficult     study and was virtually uninterpretable, this was done in 2007.  8. Bradycardia with chronotropic incompetence.  9. MICROSCOPIC HEMATURIA (ICD-599.72) 10. DERMATITIS (ICD-692.9) 11. HEARING LOSS (ICD-389.9) 12. Erectile dysfunction.   Past Surgical History: Reviewed history from 08/12/2009 and no changes required. Knee surgery 1971    Family History: Reviewed history from 08/12/2009 and no changes required. Mother deceased at age 47 with complications of myocardial infarction.  Father deceased at age 54 secondary to Alzheimer's.  Denies any family history of colon cancer or type 2 diabetes.       Social History: Reviewed history from 08/12/2009 and no changes required. 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 All other ROS were reviewed and were negative.   Vital Signs:  Patient profile:   55 year old  male Height:      74 inches Weight:      418.13 pounds BMI:     53.88 Pulse rate:   64 / minute Pulse rhythm:   regular BP sitting:   132 / 84  (left arm)  Vitals Entered By: Milford Cage NCMA (December 10, 2009 8:44 AM)  Physical Exam  General:  Well developed, well nourished, no acute distress. obese.   Head:  Normocephalic and atraumatic. Eyes:  PERRLA, no icterus. Ears:  Normal auditory acuity. Mouth:  No deformity or lesions, dentition normal. Neck:  Supple; no masses or thyromegaly. Lungs:  Clear throughout to auscultation. Heart:  Regular rate and rhythm; no murmurs, rubs,  or bruits. Abdomen:  Soft, nontender and nondistended. No masses, hepatosplenomegaly or hernias noted. Normal bowel sounds. Rectal:  deferred until time of colonoscopy.   Msk:  Symmetrical with no gross deformities. Normal posture. Pulses:  Normal pulses noted. Extremities:  No clubbing, cyanosis, edema or deformities noted. Neurologic:  Alert and  oriented x4;  grossly normal neurologically. Skin:  Intact without significant lesions or rashes. Cervical Nodes:  No significant cervical adenopathy. Inguinal Nodes:  No significant inguinal adenopathy. Psych:  Alert and cooperative. Normal mood and affect.  Impression & Recommendations:  Problem # 1:  SPECIAL SCREENING FOR MALIGNANT NEOPLASMS COLON (ICD-V76.51) Average risk for colorectal cancer. Discussed colonoscopy for colorectal cancer screening. The risks, benefits and alternatives to colonoscopy with possible biopsy and possible polypectomy were discussed with the patient and they consent to proceed. The procedure will be scheduled electively.  Problem # 2:  ANTICOAGULATION THERAPY (ICD-V58.61) Long-term Coumadin therapy for paroxysmal atrial fibrillation. The risks, benefits, and alternatives to a five-day hold Coumadin anticoagulation were discussed with the patient and he consents to proceeding. Will obtain clearance from his primary care  physician or cardiologist.  Problem # 3:  DIABETES MELLITUS-TYPE II (ICD-250.00)  Problem # 4:  OBSTRUCTIVE SLEEP APNEA (ICD-327.23) Patient is asked to bring his CPAP machine for his procedure.  Other Orders: ZCOL (ZCOL)  Patient Instructions: 1)  Colonoscopy brochure given.  2)  Conscious Sedation brochure given.  3)  Copy sent to : Thomos Lemons, DO 4)  The medication list was reviewed and reconciled.  All changed / newly prescribed medications were explained.  A complete medication list was provided to the patient / caregiver.  Prescriptions: MOVIPREP 100 GM  SOLR (PEG-KCL-NACL-NASULF-NA ASC-C) As per prep instructions.  #1 x 0   Entered by:   Christie Nottingham CMA (AAMA)   Authorized by:   Meryl Dare MD [facility]   Signed by:   Meryl Dare MD FACG on 12/10/2009   Method used:   [facility]        [id number]       [address]       [place], [place]  [address]       Ph: [phone]       Fax: [phone]   RxID:   [id number]

## 2010-11-22 NOTE — Assessment & Plan Note (Signed)
Summary: NEED CK UP  FOR JOB AND PPD/ FORM COMPLETION/HEA   Vital Signs:  Patient profile:   55 year old male Weight:      415.25 pounds BMI:     53.51 Temp:     97.8 degrees F oral Pulse rate:   66 / minute Pulse rhythm:   regular Resp:     20 per minute BP sitting:   130 / 62  (left arm) Cuff size:   Thigh  Vitals Entered By: [name] CMA (June 15, 2010 11:08 AM) CC: Physical Pain Assessment Patient in pain? no        Primary Care Deionna Marcantonio:  Dondra Spry DO  CC:  Physical.  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   P Afib - he denies palpitations  Allergies (verified): No Known Drug Allergies  Past History:  Past Medical History: 1. Hypertension. 2. Paroxysmal atrial fibrillation.  The patient was cardioverted back     in 2008.  He has been maintained on flecainide and Lopressor. 3. Type 2 diabetes.  4. Obesity.  5. Obstructive sleep apnea.  Using CPAP more.  6. Exercise treadmill Myoview, January 2010.  The patient exercised     for 5 minutes, had a hypertensive blood pressure response.  EKG was     uninterpretable due to artifact.  There is a large fixed inferior     defect with no reversibility.  EF was 54%.  It was thought that     this inferior defect maybe due to diaphragmatic attenuation given     the patient's large size. 7. Echocardiogram.  This was a very limited technically difficult abnormal cardiogram     study and was virtually uninterpretable, this was done in 2007.  8. Bradycardia with chronotropic incompetence.  9. MICROSCOPIC HEMATURIA (ICD-599.72) 10. DERMATITIS (ICD-692.9) 11. HEARING LOSS (ICD-389.9) 12. Erectile dysfunction.   Past Surgical History: Knee surgery 1971     Family History: Mother deceased at age 50 with complications of myocardial infarction.  Father deceased at age 30 secondary  to Alzheimer's.  Denies any family history of colon cancer or type 2 diabetes.        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   Head:  normocephalic and atraumatic.   Ears:  R ear normal and L ear normal.   Mouth:  pharynx pink and moist.   Neck:  Neck supple, no JVD. Thick neck.  No masses, thyromegaly, no carotid bruit Lungs:  Clear bilaterally to auscultation  Heart:  normal rate, regular rhythm, and no gallop.   Abdomen:  obese, non-tender and no masses.   Extremities:  trace left pedal edema and trace right pedal edema.   Neurologic:  cranial nerves II-XII intact and gait normal.   Psych:  normally interactive, good eye contact, not anxious appearing, and not depressed appearing.     Impression & Recommendations:  Problem # 1:  PREVENTIVE HEALTH CARE (ICD-V70.0) Reviewed adult health maintenance protocols. CPX form for substitue teaching completed Td Booster: Tdap (06/15/2010)   Chol: 142 (05/26/2009)   HDL: 43 (05/26/2009)   LDL:  72 (05/26/2009)   TG: 135 (05/26/2009) TSH: 1.72 (05/26/2008)   HgbA1C: 6.1 (08/10/2009)   PSA: 0.79 (04/17/2007)  Complete Medication List: 1)  Warfarin Sodium 10 Mg Tabs (Warfarin sodium) .Marland Kitchen.. 1 by mouth once daily as directed by coumadin clinic 2)  Flecainide Acetate 100 Mg Tabs (Flecainide acetate) .... Take one tablet by mouth every morning and 1/2 tab by mouth every evening 3)  Metoprolol Succinate 25 Mg Tb24 (Metoprolol succinate) .... Take 1/2 tablet by mouth two times a day 4)  Glucophage Xr 500 Mg Xr24h-tab (Metformin hcl) .... One by mouth two times a day before meals 5)  Cozaar 25 Mg Tabs (Losartan potassium) .... Take one tablet by mouth daily 6)  Cialis 5 Mg Tabs (Tadalafil) .Marland Kitchen.. 1 tab by mouth 30 minutes prior  to activity 7)  Pravachol 40 Mg Tabs (Pravastatin sodium) .... Take 1 tablet by mouth once a day at bedtime  Other Orders: Tdap => 36yrs IM (40102) TB Skin Test [phone]) Admin 1st Vaccine ([address]403) Admin of Any Addtl Vaccine (47425) T-Basic Metabolic Panel [phone]) T- Hemoglobin A1C (32951-88416) T-Protime, Auto (60630-16010)  Patient Instructions: 1)  [email or web address] 2)  Please schedule a follow-up appointment in 6 months.  Current Allergies (reviewed today): No known allergies      Immunizations Administered:  Tetanus Vaccine:    Vaccine Type: Tdap    Site: left deltoid    Mfr: GlaxoSmithKline    Dose: 0.5 ml    Route: IM    Given by: [name] CMA    Exp. Date: 04/21/2012    Lot #: [id number]    VIS given: 09/10/07 version given June 15, 2010.  PPD Skin Test:    Vaccine Type: PPD    Site: right forearm    Mfr: Sanofi Pasteur    Dose: 0.1 ml    Route: ID    Given by: [name] CMA    Exp. Date: 08/25/2011    Lot #: [id number]

## 2010-11-22 NOTE — Progress Notes (Signed)
Summary: Coumadin follow-up  Phone Note Outgoing Call   Call placed by: Cloyde Reams RN,  May 04, 2010 3:17 PM Call placed to: Patient Summary of Call: Pt had cardiac cath, PT/INR 13.9/1.08 on 04/22/10 preprocedure.  Pt resumed coumadin on Sunday after procedure 04/24/10.  Made f/u OV for 05/11/10 at 12:00 to check INR. Initial call taken by: Cloyde Reams RN,  May 04, 2010 3:18 PM     Appended Document: Coumadin follow-up    Clinical Lists Changes  Observations: Changed observation from NEXT PT: 03/25/2010 (03/17/2010 10:52) to NEXT PT: 05/11/10 (03/17/2010 10:52)

## 2010-11-22 NOTE — Assessment & Plan Note (Signed)
Summary: F/U CATH/ALT  Medications Added METOPROLOL SUCCINATE 25 MG  TB24 (METOPROLOL SUCCINATE) Take 1/2 tablet by mouth two times a day PRAVACHOL 40 MG TABS (PRAVASTATIN SODIUM) Take 1 tablet by mouth once a day at bedtime      Allergies Added: NKDA  Visit Type:  Follow-up Referring Provider:  Dondra Spry DO Primary Provider:  Dondra Spry DO  CC:  "full"feeling in mid chest.  History of Present Illness: Steven Beltran is seen in followup for his SVT at the hospitalization at [facility] a couple of weeks ago.  He also has a history of PAF and the question was begged with the presentation with SVT as to whether to eat or fibrillation might be a secondary arrhythmia from AVRT.  A previously obtained Myoview had been abnormal with a question as to whether it represented a real perfusion defect or diaphragmatic attenuation in this morbidly obese man, and he was subtotally referred for catheterization done last week which demonstrated no obstructive coronary disease.     Current Medications (verified): 1)  Warfarin Sodium 10 Mg Tabs (Warfarin Sodium) .Marland Kitchen.. 1 By Mouth Once Daily As Directed By Coumadin Clinic 2)  Flecainide Acetate 100 Mg Tabs (Flecainide Acetate) .... Take One Tablet By Mouth Every Morning and 1/2 Tab By Mouth Every Evening 3)  Metoprolol Succinate 25 Mg  Tb24 (Metoprolol Succinate) .... Take 1/2 Tablet By Mouth Two Times A Day 4)  Glucophage Xr 500 Mg  Xr24h-Tab (Metformin Hcl) .... One By Mouth Two Times A Day Before Meals 5)  Cozaar 25 Mg Tabs (Losartan Potassium) .... Take One Tablet By Mouth Daily 6)  Lipitor 10 Mg Tabs (Atorvastatin Calcium) .... Take One Tablet By Mouth Daily. 7)  Cialis 5 Mg Tabs (Tadalafil) .Marland Kitchen.. 1 Tab By Mouth 30 Minutes Prior To Activity  Allergies (verified): No Known Drug Allergies  Past History:  Past Surgical History: Last updated: 08/12/2009 Knee surgery 1971    Past Medical History: 1. Hypertension. 2. Paroxysmal atrial fibrillation.   The patient was cardioverted back     in 2008.  He has been maintained on flecainide and Lopressor. 3. Type 2 diabetes. 4. Obesity.  5. Obstructive sleep apnea.  Using CPAP more.  6. Exercise treadmill Myoview, January 2010.  The patient exercised     for 5 minutes, had a hypertensive blood pressure response.  EKG was     uninterpretable due to artifact.  There is a large fixed inferior     defect with no reversibility.  EF was 54%.  It was thought that     this inferior defect maybe due to diaphragmatic attenuation given     the patient's large size. 7. Echocardiogram.  This was a very limited technically difficult abnormal cardiogram     study and was virtually uninterpretable, this was done in 2007.  8. Bradycardia with chronotropic incompetence.  9. MICROSCOPIC HEMATURIA (ICD-599.72) 10. DERMATITIS (ICD-692.9) 11. HEARING LOSS (ICD-389.9) 12. Erectile dysfunction.   Vital Signs:  Patient profile:   55 year old male Height:      74 inches Weight:      417 pounds BMI:     53.73 Pulse rate:   52 / minute BP sitting:   162 / 96  (right arm) Cuff size:   large  Vitals Entered By: Steven Beltran, RMA (April 29, 2010 2:17 PM)  Physical Exam  General:  Well developed, obesity, in no acute distress. Head:  normocephalic and atraumatic Neck:  Neck supple, no  JVD. No masses, thyromegaly or abnormal cervical nodes. Lungs:  Clear bilaterally to auscultation  Heart:  Non-displaced PMI, chest non-tender; regular rate and rhythm, S1, S2 without murmurs, rubs or gallops.   Abdomen:  Bowel sounds positive; abdomen soft and non-tender   Extremities:  No clubbing or cyanosis. no edema Neurologic:  Alert and oriented x 3. Skin:  Intact without lesions or rashes.   EKG  Procedure date:  04/29/2010  Findings:      sinus rhythm at 52 Intervals 0.20/0.10 6.47 and axis of zero Q Wave inversions V2 to V4  Impression & Recommendations:  Problem # 1:  MORBID OBESITY (ICD-278.01) We  spent 20 minutes of her 35 minute visit discussing obesity importance of diet exercise possibility of bariatric surgery. He will consider this we will shoot for 5 pounds a month  Problem # 2:  CHEST PAIN, ATYPICAL (ICD-786.59)  He continues with atypical chest pains.  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 Currently he is insurance issues and his weight make an elective procedure prohibitive. I suggested that we target the next year for considering catheter ablation for his SVT as well as potentially A. implantable loop recorder to assure the identification of any subsequent atrial fibrillation.This would allow [place] down the road to stop his Coumadin  we will increase his flecainide to 100 twice a day.  Orders: EKG w/ Interpretation (93000)  Problem # 4:  HYPERTENSION (ICD-401.9) we discussed blood pressure management and the relationship to salt. His updated medication list for this problem includes:    Metoprolol Succinate 25 Mg Tb24 (Metoprolol succinate) .Marland Kitchen... Take 1/2 tablet by mouth two times a day    Cozaar 25 Mg Tabs (Losartan potassium) .Marland Kitchen... Take one tablet by mouth daily  Problem # 5:  HYPERLIPIDEMIA-MIXED (ICD-272.4) Because of cost we'll switch his Lipitor to Pravachol 40. The following medications were removed from the medication list:    Lipitor 10 Mg Tabs (Atorvastatin calcium) .Marland Kitchen... Take one tablet by mouth daily. His updated medication list for this problem includes:    Pravachol 40 Mg Tabs (Pravastatin sodium) .Marland Kitchen... Take 1 tablet by mouth once a day at bedtime  Patient Instructions: 1)  Your physician has recommended you make the following change in your medication: Start taking flecainide 100mg  two times a day and pravachol 40mg  daily Prescriptions: PRAVACHOL 40 MG TABS (PRAVASTATIN SODIUM) Take 1 tablet by mouth once a day at bedtime  #30 x 6    Entered by:   Benedict Needy, RN   Authorized by:   Nathen May, MD, [facility]   Signed by:   Benedict Needy, RN on 04/29/2010   Method used:   [facility]        [id number]       [address]       [place], [place]  [address]       Ph: [phone]       Fax: [phone]   RxID:   [id number]

## 2010-11-22 NOTE — Progress Notes (Signed)
   Phone Note Outgoing Call   Call placed by: Benedict Needy, RN,  April 21, 2010 4:03 PM Call placed to: Patient Details for Reason: Cardiac cath Summary of Call: Recieved a call from precert that pt does not have insurance at this time.  Called Dr. Graciela Husbands who requested the Cath for his imput.  Dr. Graciela Husbands would like for the cath be be done tomorrow. Called pt to give him his opptions.  Information about financial assistance left at desk for pt to pick up.  Pt in agreeance with having cath tomorrow.  Initial call taken by: Benedict Needy, RN,  April 21, 2010 4:05 PM

## 2010-11-22 NOTE — Progress Notes (Signed)
Summary: Do Not Refill Coumadin without ensuring follow-up!!  Phone Note Outgoing Call   Call placed by: Cloyde Reams RN,  June 01, 2010 2:15 PM Call placed to: Patient Summary of Call: Pt is past due for Coumadin follow-up scheduled f/u on 05/11/10, but No Showed for appt.  Pt requested refills and was given 1.  Called pharmacy and cancelled all refills.  Pt must be seen before Coumadin refills are given!! Pt has not been checked and we cannot reill if pt is not compliant with INR checks.  Called pt, scheduled f/u on 06/02/10 at 9:30am. Please ensure pt follows up before any further refills given! Initial call taken by: Cloyde Reams RN,  June 01, 2010 2:20 PM

## 2010-11-22 NOTE — Progress Notes (Signed)
Summary: cx hosp proc   Phone Note Call from Patient Call back at Home Phone [phone]   Caller: Patient Call For: [name] Reason for Call: Talk to Nurse Summary of Call: Patient wants to cx procedure that scheduled for 01-25-2010 at [facility]. states that he is not ready to have that done. Initial call taken by: Tawni Levy,  January 18, 2010 3:18 PM  Follow-up for Phone Call        Noreene Larsson in endo ntfd. to cx. pt's colon for 01/25/2010 per pt.request.. Pt.says he will call back when he is ready to schedule. Follow-up by: Teryl Lucy RN,  January 18, 2010 3:29 PM  Additional Follow-up for Phone Call Additional follow up Details #1::        Noted.  Additional Follow-up by: Meryl Dare MD Clementeen Graham,  January 18, 2010 3:51 PM

## 2010-11-22 NOTE — Progress Notes (Signed)
Summary: QUESTION ABOUT VITAMINS  Phone Note Call from Patient   Summary of Call: PT WANTS TO KNOW IF MEGA MEN VITAMINS WILL BE OK TO TAKE ALONG WITH HIS OTHER MEDICATIONS  CALL PT BACK AT [phone] Initial call taken by: Park Breed,  April 13, 2010 12:03 PM  Follow-up for Phone Call        attempted to call pt no answer after 10+ rings Benedict Needy, RN  April 13, 2010 4:39 PM  [facility] Benedict Needy, RN  April 14, 2010 8:23 AM   Pt called back told him that as far as i could see the vitamins were safe to take along with this meds but to be sure he could talk to his pharmacist.  Follow-up by: Benedict Needy, RN,  April 14, 2010 8:31 AM

## 2010-11-22 NOTE — Progress Notes (Signed)
Summary: coumadin refill request   Phone Note Refill Request Call back at Home Phone [phone] Message from:  Patient on Mar 15, 2010 9:39 AM  Refills Requested: Medication #1:  WARFARIN SODIUM 10 MG TABS 1 by mouth once daily as directed by coumadin clinic [facility] [address] [facility] [place] 4 DAYS LEFT  Initial call taken by: Harlon Flor,  Mar 15, 2010 9:39 AM  Follow-up for Phone Call        Unable to refill rx d/t pt non-compliance.  Pt has failed to follow-up and have his coumadin monitored appropriately.  Pt has been advised of this on several occassions prior to this phone note.  He has been advised to schedul a [facility] visit, but has failed to comply.  Attempted to notify pt again needs OV.  LMOM we will be unable to refill requested rx until pt is seen in [facility] for a coumadin check. Follow-up by: Cloyde Reams RN,  Mar 16, 2010 9:15 AM

## 2010-11-22 NOTE — Progress Notes (Signed)
Summary: RX   Phone Note Refill Request Call back at Home Phone [phone] Message from:  Patient on August 15, 2010 9:31 AM  Refills Requested: Medication #1:  CIALIS 5 MG TABS 1 tab by mouth 30 minutes prior to activity   Notes: WOULD LIKE THE ONE FOR DAILY USE [facility] [address]  Initial call taken by: Harlon Flor,  August 15, 2010 9:31 AM     Appended Document: RX may fill this, warn no nitrates  Appended Document: RX    Prescriptions: CIALIS 5 MG TABS (TADALAFIL) 1 tab by mouth daily 30 minutes prior to activity  #30 x 0   Entered by:   Bishop Dublin, CMA   Authorized by:   Marca Ancona, MD   Signed by:   Bishop Dublin, CMA on 08/16/2010   Method used:   Electronically to        [id number]       [address]       [place], [place]  [address]       Ph: [phone]       Fax: [phone]   RxID:   [id number]    Appended Document: RX duplicate message

## 2010-11-22 NOTE — Assessment & Plan Note (Signed)
Summary: EC6/CCR/AMD      Allergies Added: NKDA  Visit Type:  Follow-up Referring Provider:  Dondra Spry DO Primary Provider:  Dondra Spry DO  CC:  no complaints.  History of Present Illness: 55 yo with history paroxysmal atrial fibrillation, obesity, OSA on CPAP, ED,  presents for followup.    He states that his atrial fibrillation has not been a problem. He denies any tachypalpitations. He has not had to take any extra flecainide or metoprolol.  His weight has been an issue. He believes his weight is up 20 pounds and he is now over 400 pounds. He is feeling depressed, not exercising. He's been without a job for many months since the end of last year. His fiance has commented that his weight is going up.  He is using his CPAP more.   Labs (8/10): LDL 72, HDL 43, TG 135  Current Medications (verified): 1)  Warfarin Sodium 10 Mg Tabs (Warfarin Sodium) .Marland Kitchen.. 1 By Mouth Once Daily As Directed By Coumadin Clinic 2)  Flecainide Acetate 100 Mg Tabs (Flecainide Acetate) .... Take One Tablet By Mouth Every Morning and 1/2 Tab By Mouth Every Evening 3)  Metoprolol Succinate 25 Mg  Tb24 (Metoprolol Succinate) .... Take 1 Tablet By Mouth Once Daily 4)  Glucophage Xr 500 Mg  Xr24h-Tab (Metformin Hcl) .... One By Mouth Two Times A Day Before Meals 5)  Cozaar 25 Mg Tabs (Losartan Potassium) .... Take One Tablet By Mouth Daily 6)  Lipitor 10 Mg Tabs (Atorvastatin Calcium) .... Take One Tablet By Mouth Daily. 7)  Cialis 5 Mg Tabs (Tadalafil) .Marland Kitchen.. 1 Tab By Mouth 30 Minutes Prior To Activity  Allergies (verified): No Known Drug Allergies  Past History:  Past Medical History: Last updated: 08/12/2009 1. Hypertension. 2. Paroxysmal atrial fibrillation.  The patient was cardioverted back     in 2008.  He has been maintained on flecainide and Lopressor. 3. Type 2 diabetes. 4. Obesity.  5. Obstructive sleep apnea.  Using CPAP more.  6. Exercise treadmill Myoview, January 2010.  The patient  exercised     for 5 minutes, had a hypertensive blood pressure response.  EKG was     uninterpretable due to artifact.  There is a large fixed inferior     defect with no reversibility.  EF was 54%.  It was thought that     this inferior defect maybe due to diaphragmatic attenuation given     the patient's large size. 7. Echocardiogram.  This was a very limited technically difficult     study and was virtually uninterpretable, this was done in 2007.  8. Bradycardia with chronotropic incompetence.  9. MICROSCOPIC HEMATURIA (ICD-599.72) 10. DERMATITIS (ICD-692.9) 11. HEARING LOSS (ICD-389.9) 12. Erectile dysfunction.   Past Surgical History: Last updated: 08/12/2009 Knee surgery 1971    Family History: Last updated: 04/14/10 Mother deceased at age 37 with complications of myocardial infarction.  Father deceased at age 7 secondary to Alzheimer's.  Denies any family history of colon cancer or type 2 diabetes.      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  Father deceased at age 33 secondary to Alzheimer's.  Denies  any family history of colon cancer or type 2 diabetes.      Review of Systems       The patient complains of weight gain.  The patient denies fever, weight loss, vision loss, decreased hearing, hoarseness, chest pain, syncope, dyspnea on exertion, peripheral edema, prolonged cough, abdominal pain, incontinence, muscle weakness, depression, and enlarged lymph nodes.    Vital Signs:  Patient profile:   55 year old male Height:      74 inches Weight:      427 pounds BMI:     55.02 Pulse rate:   56 / minute BP sitting:   133 / 87  (left  arm) Cuff size:   large  Vitals Entered By: Bishop Dublin, CMA (March 25, 2010 4:19 PM)  Physical Exam  General:  Well developed, obesity, in no acute distress. Head:  normocephalic and atraumatic Neck:  Neck supple, no JVD. No masses, thyromegaly or abnormal cervical nodes. Lungs:  Clear bilaterally to auscultation and percussion. Heart:  Non-displaced PMI, chest non-tender; regular rate and rhythm, S1, S2 without murmurs, rubs or gallops. Carotid upstroke normal, no bruit. . Pedals normal pulses. No edema, no varicosities. Abdomen:  Bowel sounds positive; abdomen soft and non-tender without masses, organomegaly, or hernias noted.  Msk:  Back normal, normal gait. Muscle strength and tone normal. Pulses:  pulses normal in all 4 extremities Extremities:  No clubbing or cyanosis. Neurologic:  Alert and oriented x 3. Skin:  Intact without lesions or rashes. Psych:  Normal affect.    Impression & Recommendations:  Problem # 1:  ATRIAL FIBRILLATION, PAROXYSMAL (ICD-427.31) he denies any atrial fibrillation on his current medication regimen. We have made no medication changes.  His updated medication list for this problem includes:    Warfarin Sodium 10 Mg Tabs (Warfarin sodium) .Marland Kitchen... 1 by mouth once daily as directed by coumadin clinic    Flecainide Acetate 100 Mg Tabs (Flecainide acetate) .Marland Kitchen... Take one tablet by mouth every morning and 1/2 tab by mouth every evening    Metoprolol Succinate 25 Mg Tb24 (Metoprolol succinate) .Marland Kitchen... Take 1 tablet by mouth once daily  Problem # 2:  HYPERTENSION (ICD-401.9) Blood pressure is reasonably well controlled on his current medication regimen.  His updated medication list for this problem includes:    Metoprolol Succinate 25 Mg Tb24 (Metoprolol succinate) .Marland Kitchen... Take 1 tablet by mouth once daily    Cozaar 25 Mg Tabs (Losartan potassium) .Marland Kitchen... Take one tablet by mouth daily  Problem # 3:  MORBID OBESITY (ICD-278.01) We talked to him at length  about his weight which is a significant problem. We talked to him about diet, increasing his exercise, joining a gym.  Reviewed and talked to him about laparoscopic banding or gastric bypass. He would like to think about this and we have offered our help in making a referral  Patient Instructions: 1)  Your physician recommends that you continue on your current medications as directed. Please refer to the Current Medication list given to you today. 2)  Your physician encouraged you to lose weight for better health. 3)  Your physician wants you to follow-up in:   6 months You will receive a reminder letter in the mail two months in advance. If you don't receive a letter, please call our office to schedule the follow-up appointment.

## 2010-11-22 NOTE — Letter (Signed)
Summary: Diabetic Instructions  [facility]  [address] [place], [place] [address]   Phone: [phone]  Fax: [phone]    Steven Beltran 02/03/1956 MRN: [id number]   _ x _   ORAL DIABETIC MEDICATION INSTRUCTIONS           (Glucophage) The day before your procedure:   Take your diabetic pill as you do normally  The day of your procedure:   Do not take your diabetic pill    We will check your blood sugar levels during the admission process and again in Recovery before discharging you home

## 2010-11-22 NOTE — Progress Notes (Signed)
Summary: coumadin refill pending CCR visit   Phone Note Outgoing Call   Summary of Call: left message for pt that coumadin refill will be provided once pt has been in office for coumadin check. pt to call back and schedule appt.  Initial call taken by: Charlena Cross, RN, BSN,  January 13, 2010 11:01 AM

## 2010-11-22 NOTE — Progress Notes (Signed)
Summary: Metformin Refill  Phone Note Refill Request Message from:  Fax from Pharmacy  Refills Requested: Medication #1:  metformin hcl er 500 mg tablet   Dosage confirmed as above?Dosage Confirmed   Brand Name Necessary? No   Supply Requested: 1 month   Last Refilled: 06/05/2010  Method Requested: Electronic Next Appointment Scheduled: none Initial call taken by: Roselle Locus,  August 31, 2010 9:03 AM    Prescriptions: GLUCOPHAGE XR 500 MG  XR24H-TAB (METFORMIN HCL) one by mouth two times a day before meals  #60 x 3   Entered by:   [name] CMA   Authorized by:   [name] DO   Signed by:   [name] CMA on 08/31/2010   Method used:   [facility]        [id number]       [address]       [place], [place]  [address]       Ph: [phone]       Fax: [phone]   RxID:   [id number]

## 2010-11-22 NOTE — Letter (Signed)
Summary: Physical Exam Form/[facility] [employer]  Physical Exam Form/[facility] [employer]   Imported By: Lanelle Bal 06/24/2010 10:21:17  _____________________________________________________________________  External Attachment:    Type:   Image     Comment:   External Document

## 2010-11-22 NOTE — Progress Notes (Signed)
Summary: Results   Phone Note Call from Patient Call back at Home Phone [phone]   Caller: Patient Call For: RN Summary of Call: Patient would like for RN to call him with results of cath he had done last week Initial call taken by: [place],  April 28, 2010 9:39 AM  Follow-up for Phone Call        [facility] TBC Benedict Needy, RN  April 28, 2010 10:42 AM   Additional Follow-up for Phone Call Additional follow up Details #1::        Patient called you back and would like for you to return his call.  pt called scheduled appointment with Dr. Graciela Husbands.  Benedict Needy, RN  April 28, 2010 12:07 PM

## 2010-11-22 NOTE — Progress Notes (Signed)
Summary: Metoprolol Refill  Phone Note Refill Request Message from:  Fax from Pharmacy on September 19, 2010 11:40 AM  Refills Requested: Medication #1:  METOPROLOL SUCCINATE 25 MG  TB24 Take 1/2 tablet by mouth two times a day   Dosage confirmed as above?Dosage Confirmed   Brand Name Necessary? No   Supply Requested: 1 month   Last Refilled: [facility]/05/[facility] [place] fax [phone]   Method Requested: Electronic Next Appointment Scheduled: none Initial call taken by: Roselle Locus,  September 19, 2010 11:41 AM  Follow-up for Phone Call        ok to refill x 3 Follow-up by: [name] DO,  September 19, 2010 4:10 PM  Additional Follow-up for Phone Call Additional follow up Details #1::        Phone call completed rx sent to pharmacy Additional Follow-up by: [name] CMA,  September 19, 2010 4:11 PM    Prescriptions: METOPROLOL SUCCINATE 25 MG  TB24 (METOPROLOL SUCCINATE) Take 1/2 tablet by mouth two times a day  #30 x 3   Entered by:   [name] CMA   Authorized by:   [name] DO   Signed by:   [name] CMA on 09/19/2010   Method used:   Electronically to        [id number]       [address]       [place], [place]  [address]       Ph: [phone]       Fax: [phone]   RxID:   [id number]

## 2010-11-22 NOTE — Letter (Signed)
   [place] [facility] [address] [place], [place]  [address]  [place] Phone: [phone]      June 16, 2010   [place] [address] [place], [place] [address]  RE:  LAB RESULTS  Dear  Mr. HASSEBROCK,  The following is an interpretation of your most recent lab tests.  Please take note of any instructions provided or changes to medications that have resulted from your lab work.  ELECTROLYTES:  Good - no changes needed  KIDNEY FUNCTION TESTS:  Good - no changes needed    DIABETIC STUDIES:  Good - no changes needed Blood Glucose: 95   HgbA1C: 6.2          Sincerely Yours,    Dr. Thomos Lemons

## 2010-11-22 NOTE — Progress Notes (Signed)
Summary: NOT REFILLING COUMADIN   Phone Note Outgoing Call   Summary of Call: called pt to set up CCR visit.  Informed pt that coumadin refill cannot be given until pt has appt with [facility].  Pt aware and will call back.  Initial call taken by: Charlena Cross, RN, BSN,  January 31, 2010 9:09 AM

## 2010-11-22 NOTE — Progress Notes (Signed)
  Phone Note Call from Patient   Caller: Patient Details for Reason: Need Colonoscopy Summary of Call: Pt called wanting to schedule colonoscopy    screening due to age Initial call taken by: Darral Dash,  December 01, 2009 4:35 PM  Follow-up for Phone Call        Pt on [place]  will see Dr  [name]   pt notified Follow-up by: Darral Dash,  December 03, 2009 12:56 PM  New Problems: PREVENTIVE HEALTH CARE (ICD-V70.0)   New Problems: PREVENTIVE HEALTH CARE (ICD-V70.0)

## 2010-11-22 NOTE — Progress Notes (Signed)
Summary: Overdue for Coumadin follow-up  Phone Note Outgoing Call   Call placed by: Cloyde Reams RN,  May 18, 2010 11:55 AM Call placed to: Patient Summary of Call: Attempted to call pt to f/u on missed coumadin appointment.  Previously call pt, see phone note in EMR on 05/04/10 and scheduled pt a post-hopital coumadin check for 05/11/10.  Pt NOS for this appt.  LMOM for pt TCB ASAP to schedule f/u.  Will send delinquent letter as well.  Initial call taken by: Cloyde Reams RN,  May 18, 2010 11:58 AM

## 2010-11-24 NOTE — Assessment & Plan Note (Signed)
Summary: EKG;BP CHECK/AMD  Nurse Visit   Vital Signs:  Patient profile:   55 year old male Height:      74 inches Weight:      415 pounds BMI:     53.48 Pulse rate:   73 / minute BP sitting:   128 / 80  (left arm) Cuff size:   Thigh  Vitals Entered By: Bishop Dublin, CMA (November 01, 2010 2:31 PM) CC: c/o missing his dose of flecanide yesterday a.m. but took p.m. dose.  He took the a.m. dose late this afternoon because he had ran out of medication.  Denies chest pain or shortness of breath.  Checked blood pressure around 2:00 today BP 161/131 with heart rate of 73 irreg.  Comments EKG shows NSR. BP in office WNL. Explained to pt some home bp machines are positional and to make sure all air is out of cuff before inflate. If pt continues to have incr BP at home he will call office and he will bring in office for BP machine check. Pt will continue flecainaide two times a day and continue to monitor BP at home. Will call with any changes.   Past History:  Past Medical History: Last updated: 26-Jun-2010 1. Hypertension. 2. Paroxysmal atrial fibrillation.  The patient was cardioverted back     in 2008.  He has been maintained on flecainide and Lopressor. 3. Type 2 diabetes.  4. Obesity.  5. Obstructive sleep apnea.  Using CPAP more.  6. Exercise treadmill Myoview, January 2010.  The patient exercised     for 5 minutes, had a hypertensive blood pressure response.  EKG was     uninterpretable due to artifact.  There is a large fixed inferior     defect with no reversibility.  EF was 54%.  It was thought that     this inferior defect maybe due to diaphragmatic attenuation given     the patient's large size. 7. Echocardiogram.  This was a very limited technically difficult abnormal cardiogram     study and was virtually uninterpretable, this was done in 2007.  8. Bradycardia with chronotropic incompetence.  9. MICROSCOPIC HEMATURIA (ICD-599.72) 10. DERMATITIS (ICD-692.9) 11. HEARING  LOSS (ICD-389.9) 12. Erectile dysfunction.   Past Surgical History: Last updated: 06/26/10 Knee surgery 1971     Family History: Last updated: 2010-06-26 Mother deceased at age 40 with complications of myocardial infarction.  Father deceased at age 63 secondary to Alzheimer's.  Denies any family history of colon cancer or type 2 diabetes.        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 but took p.m. dose.  He took the a.m. dose late this afternoon because he had ran out of medication.  Denies chest pain or shortness of breath.  Checked blood pressure around 2:00 today BP 161/131 with heart rate of 73 irreg. .   Current Medications (verified): 1)  Warfarin Sodium 10 Mg Tabs (Warfarin Sodium) .Marland Kitchen.. 1 By Mouth Once Daily As Directed By Coumadin Clinic 2)  Flecainide Acetate 100 Mg Tabs (Flecainide Acetate) .... Take One Tablet By Mouth Every Morning and 1/2 Tab By Mouth  Every Evening 3)  Metoprolol Succinate 25 Mg  Tb24 (Metoprolol Succinate) .... Take 1/2 Tablet By Mouth Two Times A Day 4)  Glucophage Xr 500 Mg  Xr24h-Tab (Metformin Hcl) .... One By Mouth Two Times A Day Before Meals 5)  Cozaar 25 Mg Tabs (Losartan Potassium) .... Take One Tablet By Mouth Daily 6)  Cialis 5 Mg Tabs (Tadalafil) .Marland Kitchen.. 1 Tab By Mouth Daily 30 Minutes Prior To Activity 7)  Pravachol 40 Mg Tabs (Pravastatin Sodium) .... Take 1 Tablet By Mouth Once A Day At Bedtime  Allergies (verified): No Known Drug Allergies  Orders Added: 1)  EKG w/ Interpretation [93000]

## 2010-11-24 NOTE — Progress Notes (Signed)
Summary: Coumadin follow-up  Phone Note Outgoing Call   Call placed by: Cloyde Reams RN,  October 12, 2010 4:00 PM Call placed to: Patient Summary of Call: Attempted to call pt.  LMOM pt is past due for Coumadin follow-up.  LMTCB to schedule ASAP appt.  Initial call taken by: Cloyde Reams RN,  October 12, 2010 4:01 PM  Follow-up for Phone Call        Pt schduled for f/u today in office for PT/INR.  Follow-up by: Cloyde Reams RN,  October 13, 2010 8:50 AM

## 2010-11-24 NOTE — Progress Notes (Signed)
Summary: RX   Phone Note Refill Request Call back at Home Phone [phone] Message from:  Patient on November 01, 2010 9:25 AM  Refills Requested: Medication #1:  FLECAINIDE ACETATE 100 MG TABS Take one tablet by mouth every morning and 1/2 tab by mouth every evening [facility]  Initial call taken by: Harlon Flor,  November 01, 2010 9:25 AM  Follow-up for Phone Call        Rx faxed to pharmacy Follow-up by: Bishop Dublin, CMA,  November 01, 2010 9:41 AM    Prescriptions: FLECAINIDE ACETATE 100 MG TABS (FLECAINIDE ACETATE) Take one tablet by mouth every morning and 1/2 tab by mouth every evening  #45 Tablet x 3   Entered by:   Bishop Dublin, CMA   Authorized by:   Marca Ancona, MD   Signed by:   Bishop Dublin, CMA on 11/01/2010   Method used:   [facility]ron[facility] to        [id number]       [address]       [place], [place]  [address]       Ph: [phone]       Fax: [phone]   RxID:   [id number]

## 2010-11-24 NOTE — Medication Information (Signed)
Summary: 8:30 APPT/AMD  Anticoagulant Therapy  Managed by: Cloyde Reams, RN, BSN Referring MD: Sherryl Manges MD PCP: Dondra Spry DO Supervising MD: Mariah Milling Indication 1: Atrial Fibrillation (ICD-427.31) Lab Used: [facility] Site: [place] INR POC 1.9 INR RANGE 2 - 3  Dietary changes: no    Health status changes: yes       Details: Recent episode of gout, has resolved.  Bleeding/hemorrhagic complications: no    Recent/future hospitalizations: no    Any changes in medication regimen? yes       Details: Pt just completed Colchicine for gout flare up.  Recent/future dental: no  Any missed doses?: yes     Details: pt missed dose Sunday 10/09/10.  Is patient compliant with meds? no     Details: pt has not followed up since 05/2010.   Allergies: No Known Drug Allergies  Anticoagulation Management History:      The patient is taking warfarin and comes in today for a routine follow up visit.  Positive risk factors for bleeding include presence of serious comorbidities.  Negative risk factors for bleeding include an age less than 64 years old.  The bleeding index is 'intermediate risk'.  Positive CHADS2 values include History of HTN and History of Diabetes.  Negative CHADS2 values include Age > 78 years old.  The start date was 03/20/2006.  His last INR was 2.18.  Anticoagulation responsible Kyan Giannone: gollan.  INR POC: 1.9.  Cuvette Lot#: [id number].  Exp: 09/2011.    Anticoagulation Management Assessment/Plan:      The patient's current anticoagulation dose is Warfarin sodium 10 mg tabs: 1 by mouth once daily as directed by coumadin clinic.  The target INR is 2 - 3.  The next INR is due 11/09/2010.  Anticoagulation instructions were given to patient.  Results were reviewed/authorized by Cloyde Reams, RN, BSN.         Prior Anticoagulation Instructions: INR 2.18  Attempted to call pt with results.  LMOM TCB. Cloyde Reams RN  June 16, 2010  3:44 PM  Spoke with pt advised to continue on same dosage 1 tablet daily.  Recheck in 4 weeks.    Current Anticoagulation Instructions: INR 1.9  Pt will take an extra 1/2 tablet today only. Then resume 1 tablet once daily. Recheck in 4 weeks.

## 2010-12-06 ENCOUNTER — Other Ambulatory Visit (INDEPENDENT_AMBULATORY_CARE_PROVIDER_SITE_OTHER): Payer: Self-pay

## 2010-12-06 ENCOUNTER — Encounter: Payer: Self-pay | Admitting: Cardiovascular Disease

## 2010-12-06 DIAGNOSIS — Z7901 Long term (current) use of anticoagulants: Secondary | ICD-10-CM

## 2010-12-06 DIAGNOSIS — I4891 Unspecified atrial fibrillation: Secondary | ICD-10-CM

## 2010-12-06 LAB — CONVERTED CEMR LAB: POC INR: 3

## 2010-12-14 NOTE — Medication Information (Signed)
Summary: Coumadin Clinic   Anticoagulant Therapy  Managed by: Lanny Hurst, RN Referring MD: Sherryl Manges MD PCP: Dondra Spry DO Supervising MD: Mariah Milling Indication 1: Atrial Fibrillation (ICD-427.31) Lab Used: [facility] Site: [place] INR POC 3.0 INR RANGE 2 - 3  Vital Signs: Weight: 415 lbs.  Pulse Rate: 76  Blood Pressure:  132 / 90   Dietary changes: no    Health status changes: no    Bleeding/hemorrhagic complications: no    Recent/future hospitalizations: no    Any changes in medication regimen? no    Recent/future dental: no  Any missed doses?: no       Is patient compliant with meds? yes      Comments: Pt no showed at last coumadin visit. Last visit was 10/14/11, pt in today because his BP was elevated at home and we did a BP check.  Allergies: No Known Drug Allergies  Vital Signs:  Patient profile:   55 year old male Height:      74 inches Weight:      415 pounds BMI:     53.48 Pulse rate:   76 / minute BP sitting:   132 / 90  (left arm)  CC: Hypertension Comments Pt had taken BP at home with result of 180/101 and was concerned. Pt wanted to check BP here to make sure his machine at home is accurate. Pt has not taken Losartan for 4 days. He did take this AM and BP was 132/90 in office. Pt did not bring his machine with him today. Pt has no other c/o at this time. Pt's INR was also checked today, pt no showed at last coumadin visit. Will recheck in 4 weeks.   Anticoagulation Management History:      The patient is taking warfarin and comes in today for a routine follow up visit.  Positive risk factors for bleeding include presence of serious comorbidities.  Negative risk factors for bleeding include an age less than 39 years old.  The bleeding index is 'intermediate risk'.  Positive CHADS2 values include History of HTN and History of Diabetes.  Negative CHADS2 values include Age > 84 years old.  The start date was  03/20/2006.  His last INR was 2.18.  Anticoagulation responsible provider: gollan.  INR POC: 3.0.  Exp: 09/2011.    Anticoagulation Management Assessment/Plan:      The patient's current anticoagulation dose is Warfarin sodium 10 mg tabs: 1 by mouth once daily as directed by coumadin clinic.  The target INR is 2 - 3.  The next INR is due 01/04/2011.  Anticoagulation instructions were given to patient.  Results were reviewed/authorized by Lanny Hurst, RN.  He was notified by Lanny Hurst RN.         Prior Anticoagulation Instructions: INR 1.9  Pt will take an extra 1/2 tablet today only. Then resume 1 tablet once daily. Recheck in 4 weeks.  Current Anticoagulation Instructions: INR 3.0  Continue same dosage Coumadin 10mg  1 tablet every day. Recheck in 4 weeks.

## 2010-12-15 ENCOUNTER — Telehealth: Payer: Self-pay | Admitting: Internal Medicine

## 2010-12-20 NOTE — Progress Notes (Signed)
Summary: refill-metformin hcl  Phone Note Refill Request Message from:  Fax from Pharmacy on December 15, 2010 8:40 AM  Refills Requested: Medication #1:  metformin hcl er 500mg  take 1 tablet twice a day before a meal   Brand Name Necessary? No   Supply Requested: 1 month   Last Refilled: [facility]/2010 [facility] [address] [place] [address] fax [phone]    Method Requested: Electronic Next Appointment Scheduled: none Initial call taken by: Elba Barman,  December 15, 2010 8:43 AM  Follow-up for Phone Call        Pt is due for follow up this month. Left message on machine to return my call. Nicki Guadalajara Fergerson CMA Duncan Dull)  December 15, 2010 10:10 AM   Additional Follow-up for Phone Call Additional follow up Details #1::        patient returned phone call, he was reminded follow up appointment is due Additional Follow-up by: [name] CMA,  December 15, 2010 12:09 PM    Prescriptions: GLUCOPHAGE XR 500 MG  XR24H-TAB (METFORMIN HCL) one by mouth two times a day before meals  #60 x 3   Entered by:   [name] CMA   Authorized by:   [name] DO   Signed by:   [name] CMA on 12/15/2010   Method used:   Electronically to        [id number]       [address]       [place], [place]  [address]       Ph: [phone]       Fax: [phone]   RxID:   [id number]

## 2010-12-26 ENCOUNTER — Emergency Department: Payer: Self-pay | Admitting: Emergency Medicine

## 2011-01-07 ENCOUNTER — Encounter: Payer: Self-pay | Admitting: Cardiovascular Disease

## 2011-01-07 DIAGNOSIS — Z7901 Long term (current) use of anticoagulants: Secondary | ICD-10-CM | POA: Insufficient documentation

## 2011-01-07 DIAGNOSIS — I4891 Unspecified atrial fibrillation: Secondary | ICD-10-CM

## 2011-01-08 LAB — PROTIME-INR
INR: 1.08 (ref 0.00–1.49)
Prothrombin Time: 13.9 seconds (ref 11.6–15.2)

## 2011-01-08 LAB — GLUCOSE, CAPILLARY: Glucose-Capillary: 128 mg/dL — ABNORMAL HIGH (ref 70–99)

## 2011-01-11 ENCOUNTER — Encounter: Payer: Self-pay | Admitting: Emergency Medicine

## 2011-01-12 ENCOUNTER — Other Ambulatory Visit: Payer: Self-pay | Admitting: Cardiology

## 2011-01-13 ENCOUNTER — Other Ambulatory Visit: Payer: Self-pay | Admitting: Cardiology

## 2011-01-13 DIAGNOSIS — I4891 Unspecified atrial fibrillation: Secondary | ICD-10-CM

## 2011-01-13 DIAGNOSIS — I480 Paroxysmal atrial fibrillation: Secondary | ICD-10-CM

## 2011-01-13 MED ORDER — FLECAINIDE ACETATE 100 MG PO TABS: 100.0000 mg | ORAL_TABLET | Freq: Two times a day (BID) | ORAL | Status: DC

## 2011-01-26 ENCOUNTER — Other Ambulatory Visit: Payer: Self-pay | Admitting: Emergency Medicine

## 2011-01-26 MED ORDER — WARFARIN SODIUM 10 MG PO TABS: 10.0000 mg | ORAL_TABLET | ORAL | Status: DC

## 2011-01-26 NOTE — Telephone Encounter (Signed)
rx called into pharmacy/sab

## 2011-03-07 NOTE — Assessment & Plan Note (Signed)
[facility]                         ELECTROPHYSIOLOGY OFFICE NOTE   [name], Beltran                        MRN:          [id number]  DATE:03/26/2007                            DOB:          1956/10/09    Mr. Steven Beltran comes in.  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   His weight is up 20 pounds. He has had no significant problems with  palpitations.   There has been a lot of stress with his job.   CURRENT MEDICATIONS:  1. Metoprolol 25 b.i.d.  2. Flecainide 150 b.i.d.  3. Lanoxin.  4. Coumadin.   PHYSICAL EXAMINATION:  VITAL SIGNS:  His blood pressure was 138/89,  pulse 61, weight was as noted 437.  LUNGS:  Clear.  CARDIOVASCULAR:  Regular.  EXTREMITIES:  Without edema.   IMPRESSION:  1. Paroxysmal atrial fibrillation.  2. Morbid obesity.   I have advised him to think about getting rid of the chairs in his  office after having read some information on NEAT which stands for  nonexercise thermogenesis which may be a way to help him increase his  metabolic rate.  I have also encouraged him to continue working on  exercise in whatever fashion he can as his weight is going to be a huge  problem.   He will continue his current medications and we will see him in four  months' time.     Steven Salvia, MD, [facility]  Electronically Signed    SCK/MedQ  DD: 03/26/2007  DT: 03/26/2007  Job #: [id number]

## 2011-03-07 NOTE — Assessment & Plan Note (Signed)
Steven Beltran                           PRIMARY CARE OFFICE NOTE   Steven Beltran, Steven Beltran                        MRN:          [id number]  DATE:04/17/2007                            DOB:          [birth year]    CHIEF COMPLAINT:  New patient in practice.   HISTORY OF PRESENT ILLNESS:  The patient is a 55 year old African-  American male here to establish primary care.  He is followed by Dr.  Graciela Husbands of Steven Beltran.  He has a past medical history of  paroxysmal atrial fibrillation.  He has been cardioverted x2.  He has  been able to maintain sinus rhythm on Flecainide.  He also has a history  of severe obstructive sleep apnea and is followed by Steven D. Young,  MD.  He has been struggling with morbid obesity for most of his life.  After he graduated from high school he was approximately 230 pounds.  Since then he has steadily gained weight to his current weight of 431  pounds.  Review of his diet notes that he frequently eats fast food for  breakfast, lunch, and dinner.  Rough estimates note daily caloric intake  of up to 3000-4000 calories.  He does not exercise on a regular basis  and has not tried any specific weight loss programs.  He denies any  family history of type 2 diabetes.  Review of his previous lab works  notes mildly elevated glucose from 2007.   CURRENT MEDICATIONS:  1. Coumadin as directed.  2. Flecainide 150 mg b.i.d.  3. Metoprolol 25 mg daily.   ALLERGIES:  No known drug allergies.   SOCIAL HISTORY:  The patient is divorced and does not have any children.  Currently lives alone.   FAMILY HISTORY:  Mother deceased at age 91 with complications of  myocardial infarction.  Father deceased at age 40 secondary to  Alzheimer's.  Denies any family history of colon cancer or type 2  diabetes.   HABITS:  Seldom alcohol, no tobacco use.   REVIEW OF SYSTEMS:  No HEENT symptoms.  Denies any chest pain.  He  apparently had a possible  hematoma in his right axilla.  [facility] and [facility].  He denies any chronic cough  or shortness of breath.  No heartburn, nausea, vomiting, constipation,  or diarrhea.  He has not had any colon screening at age 30.  All other  symptoms are negative.   PHYSICAL EXAMINATION:  VITAL SIGNS:  Height 6 feet 2 inches, weight  431.4 pounds, temperature 98.7, pulse 53, blood pressure 152/87 in the  left arm in seated position.  GENERAL:  The patient is a very pleasant, yet morbidly obese 55 year old  African-American male in no apparent distress.  HEENT:  Normocephalic and atraumatic.  Pupils equal, round, and reactive  to light bilaterally.  Extraocular movements were intact.  The patient  was anicteric.  Conjunctivae within normal limits.  External auditory  canals and tympanic membranes were clear bilaterally.  Hearing was  grossly normal.  Oropharynx was unremarkable.  Tight  upper airway.  NECK:  Thick neck.  No adenopathy, carotid bruit, or thyromegaly.  CHEST:  Normal airway.  Chest was clear to auscultation bilaterally with  no rhonchi, rales, or wheezing.  HEART:  Regular rate and rhythm.  No significant murmurs, rubs, or  gallops appreciated.  ABDOMEN:  Quite protuberant and nontender .  Positive bowel sounds.  No  organomegaly.  No obvious rash from his overhanging pannus.  MUSCULOSKELETAL:  No cyanosis, clubbing, or edema.  NEUROLOGY:  Cranial nerves II-XII grossly intact.  He was nonfocal.   IMPRESSION:  1. History of paroxysmal atrial fibrillation, maintaining sinus rhythm      on Flecainide.  2. Chronic anticoagulation.  3. Morbid obesity with obstructive sleep apnea.  4. Hypertension uncontrolled.  5. Health maintenance.   RECOMMENDATIONS:  We discussed at length weight loss strategies and  change in his diet.  He will be referred to a nutritionist for follow-  up.  We will screen him for type 2 diabetes.  If his sugars are  elevated, he  will likely benefit from Metformin and/or Byetta therapy.  As a last resort, gastric banding versus gastric bypass.  He was warned  regarding his risk for sudden cardiac death.   The patient is to monitor his blood pressure at home with the  understanding normal blood pressure is within the range of 120/80.  If  blood pressure continues to be elevated at home, we will consider adding  ACE inhibitor versus ARB.   We will arrange follow-up in approximately 4-6 weeks.     Steven Beltran. Steven Pais, DO  Electronically Signed    RDY/MedQ  DD: 04/17/2007  DT: 04/18/2007  Job #: [id number]

## 2011-03-07 NOTE — Assessment & Plan Note (Signed)
[facility] OFFICE NOTE   Steven Beltran, Steven Beltran                        MRN:          [id number]  DATE:11/12/2008                            DOB:          11-01-1955    PRIMARY CARE PHYSICIAN:  Barbette Hair. Artist Pais, DO   HISTORY OF PRESENT ILLNESS:  This is a 55 year old with a history of  hypertension, paroxysmal atrial fibrillation, diabetes, and obesity who  presents to Cardiology Clinic for followup after recent admission to  [facility] with left shoulder pain.  The patient  was admitted to [facility] on October 19, 2008  after developing soreness in his left shoulder that came on at rest as  well as diaphoresis that was associated.  This symptom lasted for about  an hour.  He went to [facility] and was admitted  overnight.  He was ruled out for MI by 3 negative sets of cardiac  enzymes and he was discharged home.  He did come back in for a treadmill  Myoview in followup.  He exercised for 5 minutes and had a hypertensive  blood pressure response.  He reached 100% maximum predicted heart rate.  EKG was uninterpretable due to artifact.  He was noted to have a large  fixed inferior defect on perfusion images.  There was no reversibility.  EF was 54%.  This defect may indeed be due to diaphragmatic attenuation  given the patient's large size.  Since discharge, patient has been doing  well.  He has had no further shoulder pain.  He has never had chest  pain.  He is on his feet a lot of the day walking.  He denies any  dyspnea on exertion or chest pain with exertion.  He rides his exercise  bicycle now for 30 minutes a day.  He does this 4-5 times per week.  He  is having no problems with that.  Since last time we saw him, he did  have a kidney stone on the left that was diagnosed by Dr. Artist Pais, and  finally, he was noted to have resting bradycardia with heart  rate in the  40s-50s.  His heart rate however, gets up to the 90s-100s when he rides  on his exercise bicycle, and also during his exercise treadmill test, he  had a vigorous rise in his heart rate.   PAST MEDICAL HISTORY:  1. Hypertension.  2. Paroxysmal atrial fibrillation.  The patient was cardioverted back      in 2008.  He has been maintained on flecainide and Lopressor.  3. Type 2 diabetes.  4. Obesity.  5. Obstructive sleep apnea.  The patient has had CPAP in the past;      however, he is not using it.  6. Exercise treadmill Myoview, January 2010.  The patient exercised      for 5 minutes, had a hypertensive blood pressure response.  EKG was      uninterpretable due to artifact.  There is a large fixed inferior      defect with  no reversibility.  EF was 54%.  It was thought that      this inferior defect maybe due to diaphragmatic attenuation given      the patient's large size.  7. Echocardiogram.  This was a very limited technically difficult      study and was virtually uninterpretable, this was done in 2007.   SOCIAL HISTORY:  The patient is married.  He lives in [place].  He is a  nonsmoker, rarely drinks alcohol.  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   FAMILY HISTORY:  The patient's mother had an MI at age of 50.  EKG was  reviewed today shows normal sinus rhythm.  There is a first-degree AV  block, rate is 46.  There are shallow anterior T-wave inversions similar  to his EKG when he was in the hospital.  EKG from 2008 was noted to be  the same as well.   LABORATORY DATA:  Most recent labs from August 2009, LDL 104, HDL 33,  and creatinine 1.1.   REVIEW OF SYSTEMS  Negative except as noted in the history of present illness.   MEDICATIONS:  1. Coumadin.  2. Flecainide 150 mg daily.  3. Metformin 500 mg b.i.d.  4. Metoprolol 50 mg once a day.   PHYSICAL EXAMINATION:  VITAL SIGNS:  Blood pressure is 136/82, heart  rate is 46 and  regular, weight is 403 pounds.  GENERAL:  This is an obese male in no apparent distress.  NEUROLOGIC:  Alert and orient x3.  Normal affect.  LUNGS:  Clear to auscultation bilaterally with normal respiratory  effort.  CARDIOVASCULAR:  Heart regular.  S1, S2.  No S3, no S4.  There is 1+  edema in the lower legs to about halfway up.  There is no carotid bruit.  NECK:  JVP is of 7 cm water.  There is no thyromegaly or thyroid nodule.  ABDOMEN:  Soft, obese, nontender.  No hepatosplenomegaly.  Normal bowel  sounds.  EXTREMITIES:  No clubbing or cyanosis.  SKIN:  No rash.  MUSCULOSKELETAL:  Normal exam.  HEENT:  Normal exam.   ASSESSMENT AND PLAN:  This is a 55 year old with a history of paroxysmal  atrial fibrillation, hypertension, diabetes, and obesity who presents to  Cardiology Clinic for evaluation after recent admission to [facility] with shoulder pain.  1. Coronary artery disease risk.  The patient was admitted with      atypical pain to [facility].  He did have      some left shoulder soreness.  He has not had this before or after.      It was not associated with exertion.  He did rule out for      myocardial infarction.  Exercise treadmill Myoview showed a fixed      inferior defect that maybe due to diaphragmatic attenuation.  There      was no evidence for ischemia.  In general, he has reasonable      exercise tolerance with no symptoms with exertion.  He does have      multiple risk factors for coronary disease.  He is on Coumadin now      as anticoagulation.  He is also on a beta-blocker which he will      continue.  I told him that I think he probably had diaphragmatic      attenuation on his Myoview, and as he has had no further  symptoms,      I do not think we need to get a heart catheterization at this time.      However, if he has any further symptoms, given the fact that he is      on flecainide, I would probably have a low  threshold at that point      to take him for a heart catheterization.  2. Paroxysmal atrial fibrillation.  The patient is in sinus rhythm.      He is on flecainide.  He is on metoprolol.  However, he is only      taking metoprolol tartrate 50 mg once a day.  I am going to change      that to Toprol-XL 25 mg once a day for better 24-hour coverage with      a nodal blocker.  He will also continue on his Coumadin.  He is      followed up in Coumadin Clinic.  No recent episodes consistent with      atrial fibrillation.  3. Type 2 diabetes.  The patient is being managed by Dr. Artist Pais.  The      patient is on metformin.  4. Hypertension.  The patient's blood pressure is 136/82.  This is      above the goal below 130/80 in the setting of diabetes.  He is not      on an ACE inhibitor, so I am going to start him on lisinopril 10 mg      daily.  We will bring him back in 2 weeks to check his chemistries  5. Hyperlipidemia.  The patient's LDL was at 104 in August 2009.  I      would like to see his LDL at least below 100.  Therefore, we will      check his lipids again when he comes back for his Chem-7 check in 2      weeks.  If his LDL is still above 100,  I will go ahead and      initiate him on a statin.  6. The patient wants to get back on his CPAP.  He does not think he      has a mask that fits.  We will refer him back to Pulmonary to get      set back up on CPAP.     Marca Ancona, MD  Electronically Signed    DM/MedQ  DD: 11/12/2008  DT: 11/13/2008  Job #: [id number]   cc:   Barbette Hair. Artist Pais, DO

## 2011-03-10 NOTE — Assessment & Plan Note (Signed)
[facility]                                   ON-CALL NOTE   JOJUAN, CHAMPNEY                        MRN:          [id number]  DATE:06/15/2006                            DOB:          11-26-1955    PROGRESS NOTE:  Mr. Stuckey called the answering service and the answering  service paged, stating that the patient was not feeling well with gas and  back discomfort. I tried 10 times to call Mr. Podolak on the number provided.  Mr. Piechota answered the phone on 3 occasions, however, the connection was  lost. In my opinion, he was probably on a cell phone. During the 3 times  that I did hear his voice, I asked him to call the answering service back on  a regular phone. It is not clear if he received those instructions.                                   Joellyn Rued, PA-C                                Madolyn Frieze Jens Som, MD, [facility]   EW/MedQ  DD:  06/15/2006  DT:  06/16/2006  Job #:  [id number]   cc:   Duke Salvia, MD, [facility]  Osvaldo Shipper. Spruill, MD

## 2011-03-10 NOTE — Letter (Signed)
October 11, 2006     RE:  CESARE, SUMLIN  MRN:  [id number]  /  DOB:  1956/06/07   Steven Beltran. Spruill, M.D.  [address] [address]  [place] [address]   Dear Kevin Fenton,   Mr. 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  He is doing quite well.  He looks good.  He thinks that he has  lost weight but unfortunately his weight, on our scales is no different.  He is still at 415 pounds.   He describes that when he exercises his heart rate gets up to about 80  or 85.  This may be consistent with his pulse today which is 38 at rest  with a PR interval of 0.25.  His medications include Digoxin 0.25,  metoprolol 100 b.i.d. and flecainide.   As you recall his LV function is normal.   On examination, today, his blood pressure was mildly elevated at 142/98.  Te pulses are as noted.  Lungs were clear  Heart sounds were regular.   IMPRESSION:  1. Paroxysmal atrial fibrillation on flecainide.  2. Bradycardia.  3. Morbid obesity.  4. Chronic inotropic incompetence.   We will plan to:  1. Discontinue his digoxin.  2. Decrease his metoprolol to 50 b.i.d.  3. We will get a Holter monitor in 3-4 weeks time and look at what his      heart rate excursion is,      as I have advised him to undertake his exercise program while on      his monitor to see what kind of peak heart rate he gets.   We will see him, again, in 6 months.    Sincerely,      Duke Salvia, MD, [facility]  Electronically Signed    SCK/MedQ  DD: 10/11/2006  DT: 10/11/2006  Job #: [id number]

## 2011-03-10 NOTE — Assessment & Plan Note (Signed)
[facility]                              CARDIOLOGY OFFICE NOTE   CODEY, BURLING                        MRN:          [id number]  DATE:06/21/2006                            DOB:          10/05/1956    Mr. Worthing presents today for exercise treadmill study.  He is taking  flecainide.  He presented at [facility] June 14, 2006, for double  defibrillator cardioversion under anesthesia, also under flecainide.  The  patient has paroxysmal atrial fibrillation and his experience with this  dates back to May 2007.  His symptoms were tachy, palpitation, and fatigue.  Echocardiogram at that admission showed ejection fraction of 45%.  The  patient is here for exercise treadmill study and he will walk with the Bruce  protocol.  He was in sinus bradycardia at a rate of 48 beats per minute.  Blood pressure at the initiation of the study was 122/52.  He exercised  about 2 minutes, 40 seconds.  Did not achieve stage 2.  He stopped secondary  to dyspnea.  There was 1 PVC registered during the exercise.  However,  during recovery he had some isolated PVCs and 1 to 2 episodes of ventricular  bigeminy.  No NSVT or sustained VT.  The patient also complains of  persistent wrap around pain in the left chest, about under the breast.  This has been for about three weeks' duration.  He has been trying anti-  inflammatories with no result.  This pain is not worse with exertion today.  It is not postprandial.  He is not having nausea or vomiting. He has no  evidence of skin eruption on the left side to indicate a herpes zoster  outbreak.   PLAN:  The patient should continue flecainide 150 mg b.i.d. in addition to  his other medications which include:  1. Diltiazem 180 mg b.i.d.  2. Metoprolol 100 mg b.i.d.  3. Digoxin 0.25 mg daily.  4. Coumadin 5 mg Monday, Wednesday, and Friday; and 10 mg Tuesday,      Thursday, and Saturday.   In addition, a gallbladder  ultrasound will be arranged for this patient and  he will follow up with Dr. Graciela Husbands September 20 at 9:30 a.m.  Gallbladder  ultrasound results will be assessed at the September 20 visit with Dr.  Graciela Husbands.  He also will have a follow-up echocardiogram to see if his cardiac  function has improved from his 45% ejection fraction monitored in May.                                 Maple Mirza, [place]    GM/MedQ  DD:  06/21/2006  DT:  06/21/2006  Job #:  [id number]

## 2011-03-10 NOTE — Procedures (Signed)
NAME:  Steven Beltran, Steven Beltran               ACCOUNT NO.:  [id number]   MEDICAL RECORD NO.:  [id number]          PATIENT TYPE:  OUT   LOCATION:  SLEEP CENTER                 FACILITY[facility]  [facility]   PHYSICIAN:  Clinton D. Maple Hudson, M.D. DATE OF BIRTH:  1956-07-25   DATE OF STUDY:  10/12/2004                              NOCTURNAL POLYSOMNOGRAM   INDICATION FOR STUDY:  Hypersomnia with sleep apnea.  Epsworth sleepiness  score of 4/24.  Neck size 22 inches.  BMI 49.  Weight 390 pounds.   SLEEP ARCHITECTURE:  Short total time 200 minutes with sleep efficiency 42%.  Stage 1 was 26%, stage 2 was 72% and stages 3 and 4 were absent.  REM was 3%  of total sleep time.  Sleep latency 14.5 minutes.  REM latency 44.6 minutes.  Awake after sleep onset 253 minutes.  Arousal index increased at 54.   RESPIRATORY DATA:  Split study protocol.  RDI 87.8 per hour, indicating very  severe obstructive sleep apnea/hypopnea syndrome before CPAP.  This included  154 obstructive apneas and 18 hypopneas before CPAP control.  Most sleep and  therefore most events were while supine.  REM RDI was 24 per hour.  CPAP was  titrated to 13 CWP, RDI 40 per hour, reflecting some residual hypopneas over  a short monitoring interval.  The technician ran out of time at this point.  Best control was at 5 CWP, RDI 17 per hour.  Suggest review of tracings.  A  standard size [employer] HC431 nasal/oral mask was used with a heated  humidifier.   OXYGEN DATA:  Persistent snoring, moderate to loud, with oxygen desaturation  to 76% before CPAP.  After CPAP control, saturation held 90-98% on room air.  The best oxygenation apparently was with the lowest CPAP pressures,  suggesting that there may have been some induced nasal congestion.   CARDIAC DATA:  Normal sinus rhythm with sinus bradycardia to 36 beats per  minute.  Frequent PVCs with occasional bigeminy.   MOVEMENT/PARASOMNIA:  It was described as restless through the study, but  there  were no significant limb jerks or other specific motor activity.   IMPRESSION/RECOMMENDATION:  Very severe obstructive sleep apnea/hypopnea  syndrome, RDI 87.8 per hour with desaturation to 76% on room air.  CPAP  titration to 13 CWP, RDI 40 per hour with CPAP at 5 CWP recording an RDI of  17 per hour.  A standard size [employer] HC431 nasal/oral mask was  used with heated humidifier.                                                           Clinton D. Maple Hudson, M.D.  Diplomate, [employer]   CDY/MEDQ  D:  10/16/2004 13:56:07  T:  10/17/2004 15:46:57  Job:  [id number]

## 2011-03-10 NOTE — Consult Note (Signed)
NAMEKEENEN, ROESSNER               ACCOUNT NO.:  [id number]   MEDICAL RECORD NO.:  [id number]          PATIENT TYPE:  INP   LOCATION:  3728                         FACILITY:  [facility]   PHYSICIAN:  Duke Salvia, M.D.  DATE OF BIRTH:  05-10-1956   DATE OF CONSULTATION:  03/20/2006  DATE OF DISCHARGE:                                   CONSULTATION   Thank you very much for asking [place] to see Jenita Seashore in consultation  because of atrial fibrillation.   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  He has no known heart disease.  He  presented to the hospital a week ago with the abrupt onset of  tachypalpitations and a sense of flushing.  He was found to be in atrial  fibrillation with a rapid ventricular response and over the last week has  been treated medically with up-titrated doses of calcium blockers, beta  blockers, and digoxin to allow for adequacy of rate control.  TEE-guided  cardioversion was recommended but he declined (see below).   Mr. Feutz is currently unaware of his atrial fibrillation.   His thromboembolic risk factors are negative for hypertension, diabetes,  prior stroke, heart failure, and age.   Echocardiogram demonstrated an ejection fraction of 45-50%.   His related medical history is notable for obstructive sleep apnea; he also  had a cold at the time that this all presented and he was taking Allegra and  some other steroidal nose sprays.   The patient's current medications include:  1.  Avelox.  2.  Nasonex.  3.  Claritin.  4.  Lanoxin 0.25.  5.  Warfarin.  6.  Diltiazem 180 b.i.d.  7.  Lovenox 150 q.12h.  8.  Metoprolol 100 b.i.d.   ALLERGIES:  SHELLFISH.   He does not use cigarettes, alcohol, or recreational drugs.   SOCIAL HISTORY:  His issues above.  He is single, he has no children, and  works at [employer] as noted.   REVIEW OF SYSTEMS:  Noncontributory apart from the above  except for morbid  obesity.   EXAMINATION:  VITAL SIGNS:  His weight is approximately 420 pounds.  His  blood pressure was 96 to 124 over 64, his pulse was 75 and irregular.  HEENT:  Demonstrated no icterus or xanthomata.  The neck veins were flat.  Carotids were brisk and full bilaterally without bruits.  BACK:  Without kyphosis or scoliosis.  LUNGS:  Clear.  HEART SOUNDS:  Irregular without murmurs or gallops.  ABDOMEN:  Soft with active bowel sounds and quite protuberant.  EXTREMITIES:  Femoral pulses were not examined.  Distal pulses were intact.  There is no clubbing, cyanosis, or edema.  There is a well-healed scar over  his left knee.  NEUROLOGIC:  Grossly normal.   Electrocardiogram dated Mar 14, 2006, demonstrated atrial fibrillation at  113 with intervals of -/0.10/0.29.  The axis was mildly leftward at 215.   Laboratories were notable for an INR today of 1.5.  ESR was flat.  His  hemoglobin was 18.7 with a hematocrit of 55.0.  Troponins were negative.  TSH was normal.   IMPRESSION:  1.  Atrial fibrillation with a rapid ventricular response, now well rate      controlled.  2.  Thromboembolic risk factors negative.  3.  Obstructive sleep apnea on sleep mask.  4.  Morbid obesity.  5.  Polycythemia - likely secondary.   Mr. Ponciano has atrial fibrillation with a rapid ventricular response.  While  he is well rate-controlled currently, as a long-term strategy I would favor  restoration of sinus rhythm with or without the use of antiarrhythmic drugs.  The patient had an unfortunate experience when he had his knee surgery as a  high school student and is reluctant at this point to undergo anesthesia.  We have thus agreed to trying to take it on as an outpatient after 3 weeks  of pre-cardioversion anticoagulation.   He is agreeable to try this.   We have also discussed extensively the importance of weight loss, dietary  modification, and exercise programs.   We also  talked about the potential side effects of his medications including  his Coumadin.           ______________________________  Duke Salvia, M.D.     SCK/MEDQ  D:  03/20/2006  T:  03/20/2006  Job:  [id number]

## 2011-03-10 NOTE — Op Note (Signed)
Steven Beltran, Steven Beltran               ACCOUNT NO.:  [id number]   MEDICAL RECORD NO.:  [id number]          PATIENT TYPE:  OIB   LOCATION:  2854                         FACILITY:  [facility]   PHYSICIAN:  Duke Salvia, MD,FACCDATE OF BIRTH:  1956-08-01   DATE OF PROCEDURE:  DATE OF DISCHARGE:  06/14/2006                                 OPERATIVE REPORT   PREOPERATIVE DIAGNOSIS:  Atrial fibrillation.   POSTOPERATIVE DIAGNOSIS:  DC cardioversion.   DESCRIPTION OF PROCEDURE:  Upon obtaining informed consent, the patient was  administered general anesthesia by Dr. Randa Evens.  He received a total of 500  mg of pentothal.   Initially, a 200-joule biphasic shock was delivered in an anterior to  posterior configuration.  Failure to terminate atrial fibrillation.   Then, using a double dip fibrillator technique, with both positive pads  anterior and both negative pads posteriorly, a double 200-joule shock was  delivered synchronously in atrial fibrillation, restoring sinus rhythm.  The  patient tolerated the procedure without apparent complications.           ______________________________  Duke Salvia, MD,FACC     SCK/MEDQ  D:  06/14/2006  T:  06/15/2006  Job:  [id number]

## 2011-03-10 NOTE — Letter (Signed)
November 20, 2006     RE:  Steven Beltran, Steven Beltran  MRN:  [id number]  /  DOB:  08-14-1956   To Whom It May Concern:   Steven Beltran has atrial fibrillation that has been quite slow. He has been  being managed now in sinus rhythm with relative bradycardia. We were  trying to assess heart rate excursion and his chronotropic competence.  Unfortunately, his weight precludes treadmill testing in this regard and  so request the use of a repeat Holter monitor which he can utilize  during his regular exercise regimen.   This will also give [place] an idea of nocturnal bradycardia and whether  pacemaker implantation is going to be required.   Thank you for your consideration.    Sincerely,      Duke Salvia, MD, [facility]  Electronically Signed    SCK/MedQ  DD: 11/20/2006  DT: 11/20/2006  Job #: [id number]

## 2011-03-10 NOTE — Discharge Summary (Signed)
NAMEEATHEN, BUDREAU               ACCOUNT NO.:  [id number]   MEDICAL RECORD NO.:  [id number]          PATIENT TYPE:  OIB   LOCATION:  2854                         FACILITY:  [facility]   PHYSICIAN:  Maple Mirza, PA   DATE OF BIRTH:  01-Jul-1956   DATE OF ADMISSION:  06/14/2006  DATE OF DISCHARGE:  06/14/2006                                 DISCHARGE SUMMARY   PRESENTING CIRCUMSTANCE:  I am here for another cardioversion.   HISTORY OF PRESENT ILLNESS:  Mr. Helle is a 55 year old male who first  presented in May 2007, with acute onset of tachypalpitation and fatigue.  In  the emergency room he was found to be in atrial fibrillation.  This was a  new diagnosis for him.  Echocardiogram at admission showed ejection fraction  45%.   He was started on Coumadin, monitored for three weeks as an outpatient, and  had outpatient cardioversion.  At that time he had double-defibrillator  cardioversion.  This was done April 16, 2006.   He reverted to atrial fibrillation.  He was seen by Dr. Graciela Husbands in the office  who recommended a 1C agent-flecainide-especially if cardiac function were  normal and without ischemia.  The patient had an adenosine stress study May 22, 2006.  The study showed no ischemia.  The study was unable to gauge his  ejection fraction secondary to atrial dysrhythmia.  He had a repeat  echocardiogram on May 25, 2006.  Ejection fraction remains at 45%.  He was  started on oral flecainide 150 mg b.i.d. June 11, 2006, this is in  addition to his regular rate control meds.  He presents for a cardioversion  June 14, 2006, with double-defibrillator under anesthesia guidance.   ALLERGIES:  SHELLFISH.   MEDICATIONS:  1. Flecainide 150 mg b.i.d.  2. Diltiazem 180 mg b.i.d.  3. Metoprolol 100 mg b.i.d.  4. Digoxin 0.25 mg daily.  5. Coumadin 5 mg Monday, Wednesday, Friday, and Sunday; 10 mg Tuesday,      Thursday, Saturday.   PHYSICAL EXAMINATION:  VITAL SIGNS:  Temperature  97.5, pulse is 72,  respirations 18, blood pressure 143/98.  Oxygen saturation 97% on room air.  GENERAL:  He is complaining of right-sided chest pain more toward the back,  possibly a musculoskeletal problem, this is ten-day duration now.  We  recommend at the present time that he take Advil 600 mg twice a day for a 2-  3 day course.  If no relief then we will continue to pursue this as an  outpatient.  He is alert and oriented x 3.  LUNGS:  Clear to auscultation and percussion bilaterally.  HEART:  Irregular rate and rhythm but not rapid.  A 12-lead  electrocardiogram shows slow atrial fibrillation.  ABDOMEN:  Soft, nondistended.  Bowel sounds are present.  EXTREMITIES:  No edema.   IMPRESSION:  Atrial fibrillation.  Failed direct-current cardioversion.  On  rate-controlled medications only.   PLAN:  Cardioversion today under flecainide.  After discharge, the patient  will have an exercise treadmill study scheduled for Thursday, August 30th at  11:30 in  the morning at [facility], [address].  He  will see Dr. Graciela Husbands on Thursday, September 20th at 9:30 in the morning.  Hopefully he will still be in sinus rhythm on flecainide.  Plans will be  made for a follow-up echocardiogram.           ______________________________  Maple Mirza, PA     GM/MEDQ  D:  06/14/2006  T:  06/15/2006  Job:  [id number]   cc:   Osvaldo Shipper. Spruill, M.D.  Duke Salvia, MD,FACC

## 2011-03-10 NOTE — Op Note (Signed)
Steven Beltran, Steven Beltran               ACCOUNT NO.:  [id number]   MEDICAL RECORD NO.:  [id number]          PATIENT TYPE:  OIB   LOCATION:  2899                         FACILITY:  [facility]   PHYSICIAN:  Duke Salvia, M.D.  DATE OF BIRTH:  26-Aug-1956   DATE OF PROCEDURE:  04/16/2006  DATE OF DISCHARGE:                                 OPERATIVE REPORT   PREOPERATIVE DIAGNOSIS:  Atrial fibrillation.   POSTOPERATIVE DIAGNOSIS:  Sinus rhythm.   PROCEDURE:  Direct current cardioversion.   Mr. Faron Tudisco was brought in following therapeutic INRs.  He was  submitted to general anesthesia under the care of Dr. Maren Beach, M.D.  He received sodium penthothal at 425 mg.  Initially, a 200 joule shock was  delivered in atrial fibrillation failing to terminate atrial fibrillation.   We then set up a double defibrillator system and atrial fibrillation was  submitted to a synchronized 200 joule double shock, restoring sinus rhythm.   We will plan to see the patient in three weeks time with an echo to see  where his ejection fraction is.   He will continue his current medications.           ______________________________  Duke Salvia, M.D.     SCK/MEDQ  D:  04/16/2006  T:  04/16/2006  Job:  [id number]   cc:   Osvaldo Shipper. Spruill, M.D.  Fax: [phone]

## 2011-03-10 NOTE — Assessment & Plan Note (Signed)
[facility]                           ELECTROPHYSIOLOGY OFFICE NOTE   RICCARDO, HOLEMAN                        MRN:          [id number]  DATE:07/12/2006                            DOB:          13-Oct-1956    Mr. Oehlert is seen following cardioversion using a defibrillator in the  setting of high dose Flecainide at 150 mg b.i.d. having failed with anti-  arrhythmic therapy.  He has now been doing better.  He is exercising five  days a week.  He has lost 20 pounds since he was last seen so that he is  measured at 412 today.   On examination, his blood pressure is 130/76, pulse 46.   IMPRESSION:  1. Paroxysmal atrial fibrillation.  2. Obstructive sleep apnea.  3. Morbid obesity.   Mr. Nill is working on his weight.  He is working on his diet.  We will see  him again in three months.                                   Duke Salvia, MD, [facility]   SCK/MedQ  DD:  07/12/2006  DT:  07/13/2006  Job #:  [id number]   cc:   Osvaldo Shipper. Spruill, M.D.

## 2011-03-18 ENCOUNTER — Emergency Department: Payer: Self-pay | Admitting: Emergency Medicine

## 2011-03-24 ENCOUNTER — Other Ambulatory Visit: Payer: Self-pay | Admitting: Emergency Medicine

## 2011-03-24 MED ORDER — PRAVASTATIN SODIUM 40 MG PO TABS: 40.0000 mg | ORAL_TABLET | Freq: Every day | ORAL | Status: DC

## 2011-04-04 ENCOUNTER — Other Ambulatory Visit: Payer: Self-pay | Admitting: Emergency Medicine

## 2011-04-04 MED ORDER — LOSARTAN POTASSIUM 25 MG PO TABS: 25.0000 mg | ORAL_TABLET | Freq: Every day | ORAL | Status: DC

## 2011-04-14 ENCOUNTER — Telehealth: Payer: Self-pay | Admitting: *Deleted

## 2011-04-14 ENCOUNTER — Other Ambulatory Visit: Payer: Self-pay | Admitting: Cardiology

## 2011-04-14 MED ORDER — METOPROLOL TARTRATE 25 MG PO TABS: 25.0000 mg | ORAL_TABLET | Freq: Two times a day (BID) | ORAL | Status: DC

## 2011-04-14 NOTE — Telephone Encounter (Signed)
Pt notified, new rx called in for metoprolol tartrate 25mg  bid.

## 2011-04-14 NOTE — Telephone Encounter (Signed)
Pt requesting to switch from metoprolol succ 25mg  1/2 tab BID, to metoprolol tartrate for cost reasons. Told pt would discuss with Dr. Shirlee Latch and notify him of recommendation.

## 2011-04-14 NOTE — Telephone Encounter (Signed)
OK to switch to metoprolol 25 mg bid.

## 2011-05-05 ENCOUNTER — Ambulatory Visit (INDEPENDENT_AMBULATORY_CARE_PROVIDER_SITE_OTHER): Payer: Self-pay | Admitting: Cardiovascular Disease

## 2011-05-05 ENCOUNTER — Encounter: Payer: Self-pay | Admitting: Cardiovascular Disease

## 2011-05-05 ENCOUNTER — Encounter: Payer: Self-pay | Admitting: Cardiology

## 2011-05-05 DIAGNOSIS — E785 Hyperlipidemia, unspecified: Secondary | ICD-10-CM

## 2011-05-05 DIAGNOSIS — I4891 Unspecified atrial fibrillation: Secondary | ICD-10-CM

## 2011-05-05 DIAGNOSIS — G4733 Obstructive sleep apnea (adult) (pediatric): Secondary | ICD-10-CM

## 2011-05-05 DIAGNOSIS — E119 Type 2 diabetes mellitus without complications: Secondary | ICD-10-CM

## 2011-05-05 DIAGNOSIS — R7309 Other abnormal glucose: Secondary | ICD-10-CM

## 2011-05-05 DIAGNOSIS — I10 Essential (primary) hypertension: Secondary | ICD-10-CM

## 2011-05-05 MED ORDER — METOPROLOL TARTRATE 25 MG PO TABS: 12.5000 mg | ORAL_TABLET | Freq: Two times a day (BID) | ORAL | Status: DC

## 2011-05-05 NOTE — Progress Notes (Signed)
Patient ID: Steven Beltran, male    DOB: [birth year], 55 y.o.     MRN: [id number]  HPI Comments: 55 yo with history paroxysmal atrial fibrillation, Morbid obesity, OSA on CPAP, ED,  presents for followup.   Also a history of SVT.   He reports that he has had difficulty affording his tonight and metoprolol. He sometimes skips doses to make ends meet. Recently, he has been feeling "off-center". He is unable to put his finger on it. He has noticed bradycardia with exertion such as when he is on an exercise bike. No syncope or passout spells. Sleep has been adequate. He does not appreciate any episodes of atrial fibrillation though did go to the hospital May 26 with palpitations. EKG showed normal sinus rhythm with APCs.   His weight has been an issue.  he is now over 400 pounds.  He's been without a job for many months since the end of last year.  Steven Beltran is seen in followup for his SVT at the hospitalization at [facility] a couple of weeks ago.    Myoview with  diaphragmatic attenuation Versus ischemia Previous catheterization  Showed no obstructive coronary disease.  EKG shows normal sinus rhythm with rate 54 beats per minute, T-wave abnormality in leads V1 through V4     Outpatient Encounter Prescriptions as of 05/05/2011  Medication Sig Dispense Refill  . flecainide (TAMBOCOR) 100 MG tablet Take 1 tablet (100 mg total) by mouth 2 (two) times daily.  60 tablet  6  . losartan (COZAAR) 25 MG tablet Take 1 tablet (25 mg total) by mouth daily.  30 tablet  1  . metFORMIN (GLUCOPHAGE-XR) 500 MG 24 hr tablet Take 500 mg by mouth 2 (two) times daily before a meal.        . pravastatin (PRAVACHOL) 40 MG tablet Take 1 tablet (40 mg total) by mouth daily.  30 tablet  1  . tadalafil (CIALIS) 5 MG tablet Take 5 mg by mouth daily as needed. 30 min prior to activity       . warfarin (COUMADIN) 10 MG tablet Take 1 tablet (10 mg total) by mouth as directed.  30 tablet  6  . metoprolol tartrate (LOPRESSOR) 25 MG  tablet Take 1 tablet (25 mg total) by mouth 2 (two) times daily.  60 tablet  6      Review of Systems  Constitutional: Positive for fatigue.  HENT: Negative.   Eyes: Negative.   Respiratory: Negative.   Cardiovascular: Negative.   Gastrointestinal: Negative.   Musculoskeletal: Negative.   Skin: Negative.   Neurological: Positive for dizziness.       " off center"  Hematological: Negative.   Psychiatric/Behavioral: Positive for sleep disturbance and dysphoric mood.  All other systems reviewed and are negative.    BP 118/76  Pulse 53  Ht 6\' 2"  (1.88 m)  Wt 393 lb (178.264 kg)  BMI 50.46 kg/m2  Physical Exam  Nursing note and vitals reviewed. Constitutional: He is oriented to person, place, and time. He appears well-developed and well-nourished.       Morbidly obese  HENT:  Head: Normocephalic.  Nose: Nose normal.  Mouth/Throat: Oropharynx is clear and moist.  Eyes: Conjunctivae are normal. Pupils are equal, round, and reactive to light.  Neck: Normal range of motion. Neck supple. No JVD present.  Cardiovascular: Normal rate, regular rhythm, S1 normal, S2 normal, normal heart sounds and intact distal pulses.  Exam reveals no gallop and no friction rub.   No murmur  heard. Pulmonary/Chest: Effort normal and breath sounds normal. No respiratory distress. He has no wheezes. He has no rales. He exhibits no tenderness.  Abdominal: Soft. Bowel sounds are normal. He exhibits no distension. There is no tenderness.  Musculoskeletal: Normal range of motion. He exhibits edema. He exhibits no tenderness.  Lymphadenopathy:    He has no cervical adenopathy.  Neurological: He is alert and oriented to person, place, and time. Coordination normal.  Skin: Skin is warm and dry. No rash noted. No erythema.  Psychiatric: He has a normal mood and affect. His behavior is normal. Judgment and thought content normal.           Assessment and Plan

## 2011-05-05 NOTE — Patient Instructions (Addendum)
You are doing well. Please decrease you metoprolol to 12.5 mg twice a day.  Call the office with heart rate numbers over the next week Please call us if you have new issues that need to be addressed before your next appt.  We will call you for a follow up Appt. In 6 months

## 2011-05-06 NOTE — Assessment & Plan Note (Signed)
We'll discuss gastric bypass with him on his next visit.

## 2011-05-06 NOTE — Assessment & Plan Note (Signed)
No clear evidence and no symptoms of atrial fibrillation. He is missing doses of his medications secondary to financial reasons. He does not feel well in general. I'm concerned this could be secondary to underlying bradycardia as he does report heart rates in the low 50s and poor exercise tolerance. We will decrease his metoprolol to 12.5 mg b.i.d..

## 2011-05-06 NOTE — Assessment & Plan Note (Signed)
Blood pressure is well controlled on today's visit. No changes made to the medications. 

## 2011-05-06 NOTE — Assessment & Plan Note (Signed)
Wears CPAP. His weight is a major issue.

## 2011-05-06 NOTE — Assessment & Plan Note (Signed)
We have encouraged continued exercise, careful diet management in an effort to lose weight. 

## 2011-05-10 ENCOUNTER — Encounter: Payer: Self-pay | Admitting: Emergency Medicine

## 2011-05-24 ENCOUNTER — Other Ambulatory Visit: Payer: Self-pay | Admitting: *Deleted

## 2011-05-24 MED ORDER — LOSARTAN POTASSIUM 25 MG PO TABS: 25.0000 mg | ORAL_TABLET | Freq: Every day | ORAL | Status: DC

## 2011-07-06 ENCOUNTER — Other Ambulatory Visit: Payer: Self-pay | Admitting: Internal Medicine

## 2011-07-07 ENCOUNTER — Other Ambulatory Visit: Payer: Self-pay | Admitting: Internal Medicine

## 2011-07-07 MED ORDER — METFORMIN HCL ER 500 MG PO TB24: 500.0000 mg | ORAL_TABLET | Freq: Two times a day (BID) | ORAL | Status: DC

## 2011-07-07 NOTE — Telephone Encounter (Signed)
Pt called req a refill of metFORMIN (GLUCOPHAGE-XR) 500 MG to [facility] [address] in [place]. Pt has sch and ov on Tues 07/11/11.

## 2011-07-07 NOTE — Telephone Encounter (Signed)
Call pt - rx refilled

## 2011-07-10 NOTE — Telephone Encounter (Signed)
Pt aware.

## 2011-07-11 ENCOUNTER — Encounter: Payer: Self-pay | Admitting: Internal Medicine

## 2011-07-11 ENCOUNTER — Ambulatory Visit (INDEPENDENT_AMBULATORY_CARE_PROVIDER_SITE_OTHER): Payer: BC Managed Care – PPO | Admitting: Internal Medicine

## 2011-07-11 DIAGNOSIS — E785 Hyperlipidemia, unspecified: Secondary | ICD-10-CM

## 2011-07-11 DIAGNOSIS — R7309 Other abnormal glucose: Secondary | ICD-10-CM

## 2011-07-11 DIAGNOSIS — I10 Essential (primary) hypertension: Secondary | ICD-10-CM

## 2011-07-11 DIAGNOSIS — E119 Type 2 diabetes mellitus without complications: Secondary | ICD-10-CM | POA: Insufficient documentation

## 2011-07-11 LAB — LIPID PANEL
HDL: 43.8 mg/dL (ref >39.00)
VLDL: 30.4 mg/dL (ref 0.0–40.0)

## 2011-07-11 LAB — HEPATIC FUNCTION PANEL
ALT: 19 U/L (ref 0–53)
Total Bilirubin: 0.5 mg/dL (ref 0.3–1.2)
Total Protein: 7 g/dL (ref 6.0–8.3)

## 2011-07-11 LAB — BASIC METABOLIC PANEL
Calcium: 9 mg/dL (ref 8.4–10.5)
GFR: 112.11 mL/min (ref >60.00)
Glucose, Bld: 77 mg/dL (ref 70–99)
Sodium: 139 mEq/L (ref 135–145)

## 2011-07-11 LAB — HEMOGLOBIN A1C: Hgb A1c MFr Bld: 6 % (ref 4.6–6.5)

## 2011-07-11 MED ORDER — METFORMIN HCL 500 MG PO TABS: 750.0000 mg | ORAL_TABLET | Freq: Two times a day (BID) | ORAL | Status: DC

## 2011-07-11 NOTE — Assessment & Plan Note (Signed)
I discussed option of wt loss surgery.  He is not ready to consider.  We will exhaust all non surgical tx options first.

## 2011-07-11 NOTE — Assessment & Plan Note (Signed)
Pt has started exercise program at home resulting in modest wt loss.  He understands his weight is major issue / basis for his chronic medical conditions. Increase metformin to 750 mg bid Monitor A1c We discussed option of gastric bypass.  He is motivated to commit to non surgical treatment options. We will try to arrange referral to good personal trainer.  He needs additional motivation.

## 2011-07-11 NOTE — Progress Notes (Signed)
Subjective:    Patient ID: Steven Beltran, male    DOB: [birth year], 55 y.o.   MRN: [id number]  HPI  55 y/o AA male with morbid obesity, P Afib, Htn , DM II, and OSA for follow up.  Overall pt has been doing well.  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  He has started exercise program at home and lost modest amt of weight.   Cardiologist recently reduced dose of metoprolol which has helped his exercise tolerance.  He does not follow calorie restricted diet.    He is tolerating metformin.  No GI side effects.   Review of Systems Negative for chest pain or SOB  Past Medical History  Diagnosis Date  . HTN (hypertension)   . Paroxysmal atrial fibrillation     cardioverted in 2008; been maintained on flecainide and lopressor.   . Diabetes type 2, controlled     controlled - not specifed  . Obesity   . OSA (obstructive sleep apnea)     using CPAP more  . Bradycardia     with chronotropic incompetence   . Microscopic hematuria   . Dermatitis   . Hearing loss   . ED (erectile dysfunction)     History   Social History  . Marital Status: Single    Spouse Name: N/A    Number of Children: N/A  . Years of Education: N/A   Occupational History  . Not on file.   Social History Main Topics  . Smoking status: Never Smoker   . Smokeless tobacco: Not on file  . Alcohol Use: Yes     seldom   . Drug Use: No  . Sexually Active: Not on file   Other Topics Concern  . Not on file   Social History Narrative   Steffanie Rainwater; 2 boys. 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 Pt signed Designated Party Release form granting access to [facility] to no one. Detailed msg may be left on cell phone [phone]. Roselle Locus 06/15/10.     Past Surgical History  Procedure Date  . Knee surgery 1971     Family History  Problem Relation Age of Onset  . Colon cancer Neg Hx   . Diabetes type II Neg Hx     No Known Allergies  Current Outpatient  Prescriptions on File Prior to Visit  Medication Sig Dispense Refill  . flecainide (TAMBOCOR) 100 MG tablet Take 1 tablet (100 mg total) by mouth 2 (two) times daily.  60 tablet  6  . losartan (COZAAR) 25 MG tablet Take 1 tablet (25 mg total) by mouth daily.  30 tablet  6  . metoprolol tartrate (LOPRESSOR) 25 MG tablet Take 0.5 tablets (12.5 mg total) by mouth 2 (two) times daily.  60 tablet  6  . pravastatin (PRAVACHOL) 40 MG tablet Take 1 tablet (40 mg total) by mouth daily.  30 tablet  1  . tadalafil (CIALIS) 5 MG tablet Take 5 mg by mouth daily as needed. 30 min prior to activity       . warfarin (COUMADIN) 10 MG tablet Take 1 tablet (10 mg total) by mouth as directed.  30 tablet  6    BP 120/82  Pulse 64  Temp(Src) 97.5 F (36.4 C) (Oral)  Ht 6\' 1"  (1.854 m)  Wt 391 lb (177.356 kg)  BMI 51.59 kg/m2       Objective:   Physical Exam  Constitutional: pleasant, morbidly obese Head: Normocephalic and atraumatic.  Right Ear: External ear normal.  Left Ear: External ear normal.  Mouth/Throat: Oropharynx is clear and moist.  Eyes: Conjunctivae are normal. Pupils are equal, round, and reactive to light.  Neck: Normal range of motion. Neck supple. No thyromegaly present. No carotid bruit Cardiovascular: Normal rate, regular rhythm and normal heart sounds.  Exam reveals no gallop and no friction rub.   No murmur heard. Pulmonary/Chest: Effort normal and breath sounds normal.  No wheezes. No rales.  Abdominal: Soft. Bowel sounds are normal. No mass. There is no tenderness.  Neurological: Alert. No cranial nerve deficit.  Skin: Skin is warm and dry.  Psychiatric: Normal mood and affect. Behavior is normal.       Assessment & Plan:

## 2011-07-11 NOTE — Assessment & Plan Note (Deleted)
Increase metformin to 750 mg po bid.  Monitor A1c.  Continue regular exercise.  Refer to personal trainer.  We discussed starting with low intensity exercises.

## 2011-07-11 NOTE — Assessment & Plan Note (Signed)
Well controlled.  He is feeling better and exercise tolerance has improved since b blocker dose reduced.

## 2011-07-11 NOTE — Patient Instructions (Signed)
Goal weight loss before next office visit - 5 to 6 lbs Our office will contact you re:  Contact information for personal trainer

## 2011-07-12 ENCOUNTER — Encounter: Payer: Self-pay | Admitting: Internal Medicine

## 2011-08-10 ENCOUNTER — Ambulatory Visit: Payer: BC Managed Care – PPO | Admitting: Internal Medicine

## 2011-08-10 LAB — CBC
HCT: 39.3
Hemoglobin: 13.1
MCHC: 33.4
MCV: 83.9
Platelets: 161
RBC: 4.68
RDW: 15.2 — ABNORMAL HIGH
WBC: 14 — ABNORMAL HIGH

## 2011-08-10 LAB — DIFFERENTIAL
Basophils Absolute: 0.1
Basophils Relative: 1
Eosinophils Absolute: 0.1
Eosinophils Relative: 1
Lymphocytes Relative: 19
Lymphs Abs: 2.6
Monocytes Absolute: 0.8 — ABNORMAL HIGH
Monocytes Relative: 6
Neutro Abs: 10.4 — ABNORMAL HIGH
Neutrophils Relative %: 75

## 2011-08-10 LAB — POCT CARDIAC MARKERS
CKMB, poc: 1.5
Myoglobin, poc: 114
Operator id: 198171
Troponin i, poc: <0.05

## 2011-08-10 LAB — PROTIME-INR
INR: 3 — ABNORMAL HIGH
Prothrombin Time: 33.1 — ABNORMAL HIGH

## 2011-09-08 ENCOUNTER — Ambulatory Visit: Payer: BC Managed Care – PPO | Admitting: Internal Medicine

## 2011-09-13 ENCOUNTER — Ambulatory Visit (INDEPENDENT_AMBULATORY_CARE_PROVIDER_SITE_OTHER): Payer: BC Managed Care – PPO | Admitting: Internal Medicine

## 2011-09-13 ENCOUNTER — Other Ambulatory Visit: Payer: Self-pay

## 2011-09-13 ENCOUNTER — Encounter: Payer: Self-pay | Admitting: Internal Medicine

## 2011-09-13 DIAGNOSIS — Z7901 Long term (current) use of anticoagulants: Secondary | ICD-10-CM

## 2011-09-13 DIAGNOSIS — I4891 Unspecified atrial fibrillation: Secondary | ICD-10-CM

## 2011-09-13 DIAGNOSIS — I480 Paroxysmal atrial fibrillation: Secondary | ICD-10-CM

## 2011-09-13 DIAGNOSIS — R319 Hematuria, unspecified: Secondary | ICD-10-CM

## 2011-09-13 DIAGNOSIS — R310 Gross hematuria: Secondary | ICD-10-CM

## 2011-09-13 LAB — POCT URINALYSIS DIPSTICK
Bilirubin, UA: NEGATIVE
Leukocytes, UA: NEGATIVE
Nitrite, UA: NEGATIVE
Protein, UA: NEGATIVE
Urobilinogen, UA: 0.2
pH, UA: 5.5

## 2011-09-13 MED ORDER — FLECAINIDE ACETATE 100 MG PO TABS: 100.0000 mg | ORAL_TABLET | Freq: Two times a day (BID) | ORAL | Status: DC

## 2011-09-13 NOTE — Assessment & Plan Note (Signed)
55 year old [place] American male who presents with gross hematuria after sexual activity. I suspect hematuria from trauma to urethra. INR today is 2.3. Patient advised to hold Coumadin x3 days.  Patient also advised to discontinue all aspirin-containing analgesics and NSAIDs.  Obtain renal ultrasound and bladder ultrasound to rule out other cause.  Reassess in one week.  Please call our office if your symptoms do not improve or gets worse.

## 2011-09-13 NOTE — Progress Notes (Signed)
Subjective:    Patient ID: Steven Beltran, male    DOB: [birth year], 55 y.o.   MRN: [id number]  HPI 55 year old African American male with history of paroxysmal atrial fibrillation on chronic anticoagulation complains of gross hematuria x4-5 days. Symptoms started after sexual intercourse previous Saturday. Patient reports it has been > 6 months since his last sexual activity. Soon thereafter patient noticed clots in his urine. His symptoms have not been consistent. Hematuria seemed to resolve but over last 1-2 days he has noticed small blood clots when he first urinates.  Patient admits to using over-the-counter analgesics. He has been using Goody powder and other aspirin containing products for headache.  It has been several weeks since his last INR check.   Review of Systems Negative for fever or back pain Past Medical History  Diagnosis Date  . HTN (hypertension)   . Paroxysmal atrial fibrillation     cardioverted in 2008; been maintained on flecainide and lopressor.   . Diabetes type 2, controlled     controlled - not specifed  . Obesity   . OSA (obstructive sleep apnea)     using CPAP more  . Bradycardia     with chronotropic incompetence   . Microscopic hematuria   . Dermatitis   . Hearing loss   . ED (erectile dysfunction)     History   Social History  . Marital Status: Single    Spouse Name: N/A    Number of Children: N/A  . Years of Education: N/A   Occupational History  . Not on file.   Social History Main Topics  . Smoking status: Never Smoker   . Smokeless tobacco: Not on file  . Alcohol Use: Yes     seldom   . Drug Use: No  . Sexually Active: Not on file   Other Topics Concern  . Not on file   Social History Narrative   Steffanie Rainwater; 2 boys. 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 Pt signed Designated Party Release form granting access to [facility] to no one. Detailed msg may be left on cell phone [phone]. Roselle Locus 06/15/10.     Past  Surgical History  Procedure Date  . Knee surgery 1971     Family History  Problem Relation Age of Onset  . Colon cancer Neg Hx   . Diabetes type II Neg Hx     No Known Allergies  Current Outpatient Prescriptions on File Prior to Visit  Medication Sig Dispense Refill  . losartan (COZAAR) 25 MG tablet Take 1 tablet (25 mg total) by mouth daily.  30 tablet  6  . metFORMIN (GLUCOPHAGE) 500 MG tablet Take 1.5 tablets (750 mg total) by mouth 2 (two) times daily with a meal.  90 tablet  3  . metoprolol tartrate (LOPRESSOR) 25 MG tablet Take 0.5 tablets (12.5 mg total) by mouth 2 (two) times daily.  60 tablet  6  . pravastatin (PRAVACHOL) 40 MG tablet Take 1 tablet (40 mg total) by mouth daily.  30 tablet  1  . tadalafil (CIALIS) 5 MG tablet Take 5 mg by mouth daily as needed. 30 min prior to activity       . warfarin (COUMADIN) 10 MG tablet Take 1 tablet (10 mg total) by mouth as directed.  30 tablet  6    BP 122/80  Wt 391 lb (177.356 kg)       Objective:   Physical Exam   Constitutional:  pleasant no  acute distress Cardiovascular: Normal rate, regular rhythm and normal heart sounds.  Exam reveals no gallop and no friction rub.  No murmur heard. Pulmonary/Chest: Effort normal and breath sounds normal.  No wheezes. No rales.  Abdominal: Soft. Bowel sounds are normal. No mass. There is no tenderness.  GU:  Uncircumcised, no obvious urethral trauma ,  no testicular mass, no obvious hernia  Neurological: Alert. No cranial nerve deficit.  Skin: Skin is warm and dry.  Psychiatric: Normal mood and affect. Behavior is normal.      Assessment & Plan:

## 2011-09-13 NOTE — Patient Instructions (Addendum)
Stop all aspirin containing products Hold coumadin x 3 days Return to our office in 1 week for INR check Call our office if experience persistent blood in your urine.

## 2011-09-20 ENCOUNTER — Ambulatory Visit: Payer: BC Managed Care – PPO | Admitting: Internal Medicine

## 2011-09-20 ENCOUNTER — Other Ambulatory Visit: Payer: Self-pay | Admitting: Internal Medicine

## 2011-09-21 ENCOUNTER — Ambulatory Visit
Admission: RE | Admit: 2011-09-21 | Discharge: 2011-09-21 | Disposition: A | Discharge status: Home / Self Care | Payer: BC Managed Care – PPO | Source: Ambulatory Visit | Attending: Internal Medicine | Admitting: Internal Medicine

## 2011-09-22 NOTE — Progress Notes (Signed)
Quick Note:  Left a message for pt to return call. ______ 

## 2011-09-27 ENCOUNTER — Ambulatory Visit (INDEPENDENT_AMBULATORY_CARE_PROVIDER_SITE_OTHER): Payer: BC Managed Care – PPO | Admitting: Internal Medicine

## 2011-09-27 DIAGNOSIS — I4891 Unspecified atrial fibrillation: Secondary | ICD-10-CM

## 2011-09-27 DIAGNOSIS — Z7901 Long term (current) use of anticoagulants: Secondary | ICD-10-CM

## 2011-09-27 DIAGNOSIS — R8290 Unspecified abnormal findings in urine: Secondary | ICD-10-CM

## 2011-09-27 DIAGNOSIS — R319 Hematuria, unspecified: Secondary | ICD-10-CM

## 2011-09-27 DIAGNOSIS — R310 Gross hematuria: Secondary | ICD-10-CM

## 2011-09-27 NOTE — Assessment & Plan Note (Signed)
Resolved.  Patient's renal ultrasound was normal. He has microscopic hematuria on urinalysis. I suggest we repeat UA in one month. If persistent microscopic hematuria we discussed referral to urology.

## 2011-09-27 NOTE — Progress Notes (Signed)
Subjective:    Patient ID: Steven Beltran, male    DOB: 1956-03-17, 55 y.o.   MRN: [id number]  HPI  55 year old [place] American male previously seen for gross hematuria for followup. Patient held his Coumadin for 3 days and has stopped all aspirin-containing over-the-counter agents. Patient reports gross hematuria has resolved. His followup INR is within normal limits.   Lab Results  Component Value Date   INR 2.2 09/27/2011   INR 2.3 09/13/2011   INR 2.18* 06/15/2010    He is not having any urinary symptoms.  Renal ultrasound was normal.  He was never a smoker.   Review of Systems Negative for dysuria,  No genital pain or discomfort  Past Medical History  Diagnosis Date  . HTN (hypertension)   . Paroxysmal atrial fibrillation     cardioverted in 2008; been maintained on flecainide and lopressor.   . Diabetes type 2, controlled     controlled - not specifed  . Obesity   . OSA (obstructive sleep apnea)     using CPAP more  . Bradycardia     with chronotropic incompetence   . Microscopic hematuria   . Dermatitis   . Hearing loss   . ED (erectile dysfunction)     History   Social History  . Marital Status: Single    Spouse Name: N/A    Number of Children: N/A  . Years of Education: N/A   Occupational History  . Not on file.   Social History Main Topics  . Smoking status: Never Smoker   . Smokeless tobacco: Not on file  . Alcohol Use: Yes     seldom   . Drug Use: No  . Sexually Active: Not on file   Other Topics Concern  . Not on file   Social History Narrative   Steffanie Rainwater; 2 boys. 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 Pt signed Designated Party Release form granting access to [facility] to no one. Detailed msg may be left on cell phone [phone]. Roselle Locus 06/15/10.     Past Surgical History  Procedure Date  . Knee surgery 1971     Family History  Problem Relation Age of Onset  . Colon cancer Neg Hx   . Diabetes type II Neg Hx      No Known Allergies  Current Outpatient Prescriptions on File Prior to Visit  Medication Sig Dispense Refill  . flecainide (TAMBOCOR) 100 MG tablet Take 1 tablet (100 mg total) by mouth 2 (two) times daily.  60 tablet  6  . losartan (COZAAR) 25 MG tablet Take 1 tablet (25 mg total) by mouth daily.  30 tablet  6  . metFORMIN (GLUCOPHAGE) 500 MG tablet Take 1.5 tablets (750 mg total) by mouth 2 (two) times daily with a meal.  90 tablet  3  . metoprolol tartrate (LOPRESSOR) 25 MG tablet Take 0.5 tablets (12.5 mg total) by mouth 2 (two) times daily.  60 tablet  6  . pravastatin (PRAVACHOL) 40 MG tablet Take 1 tablet (40 mg total) by mouth daily.  30 tablet  1  . tadalafil (CIALIS) 5 MG tablet Take 5 mg by mouth daily as needed. 30 min prior to activity       . warfarin (COUMADIN) 10 MG tablet TAKE 1 TABLET BY MOUTH ONCE DAILY AS DIRECTED BY COUMADIN CLINIC  90 tablet  0    BP 124/76  Pulse 68  Temp(Src) 98 F (36.7 C) (Oral)  Objective:   Physical Exam   Constitutional: Appears well-developed and well-nourished. No distress.  Cardiovascular: Normal rate, regular rhythm and normal heart sounds.  Exam reveals no gallop and no friction rub.  No murmur heard. Pulmonary/Chest: Effort normal and breath sounds normal.  No wheezes. No rales.  Abdominal: Soft. Bowel sounds are normal. No mass. There is no tenderness.  Neurological: Alert. No cranial nerve deficit.  Skin: Skin is warm and dry.  Psychiatric: Normal mood and affect. Behavior is normal.      Assessment & Plan:

## 2011-09-27 NOTE — Patient Instructions (Addendum)
  Latest dosing instructions   Total Sun Mon Tue Wed Thu Fri Sat   70 10 mg 10 mg 10 mg 10 mg 10 mg 10 mg 10 mg    (10 mg1) (10 mg1) (10 mg1) (10 mg1) (10 mg1) (10 mg1) (10 mg1)       Please return in one month for repeat urinalysis.

## 2011-10-03 ENCOUNTER — Other Ambulatory Visit: Payer: Self-pay | Admitting: *Deleted

## 2011-10-03 MED ORDER — PRAVASTATIN SODIUM 40 MG PO TABS: 40.0000 mg | ORAL_TABLET | Freq: Every day | ORAL | Status: DC

## 2011-10-22 IMAGING — CR DG CHEST 1V PORT
1 series · 1 of 1 positions shown · non-contrast
Comparison: none

REASON FOR EXAM: Chest Pain
COMMENTS:

[view not recorded]
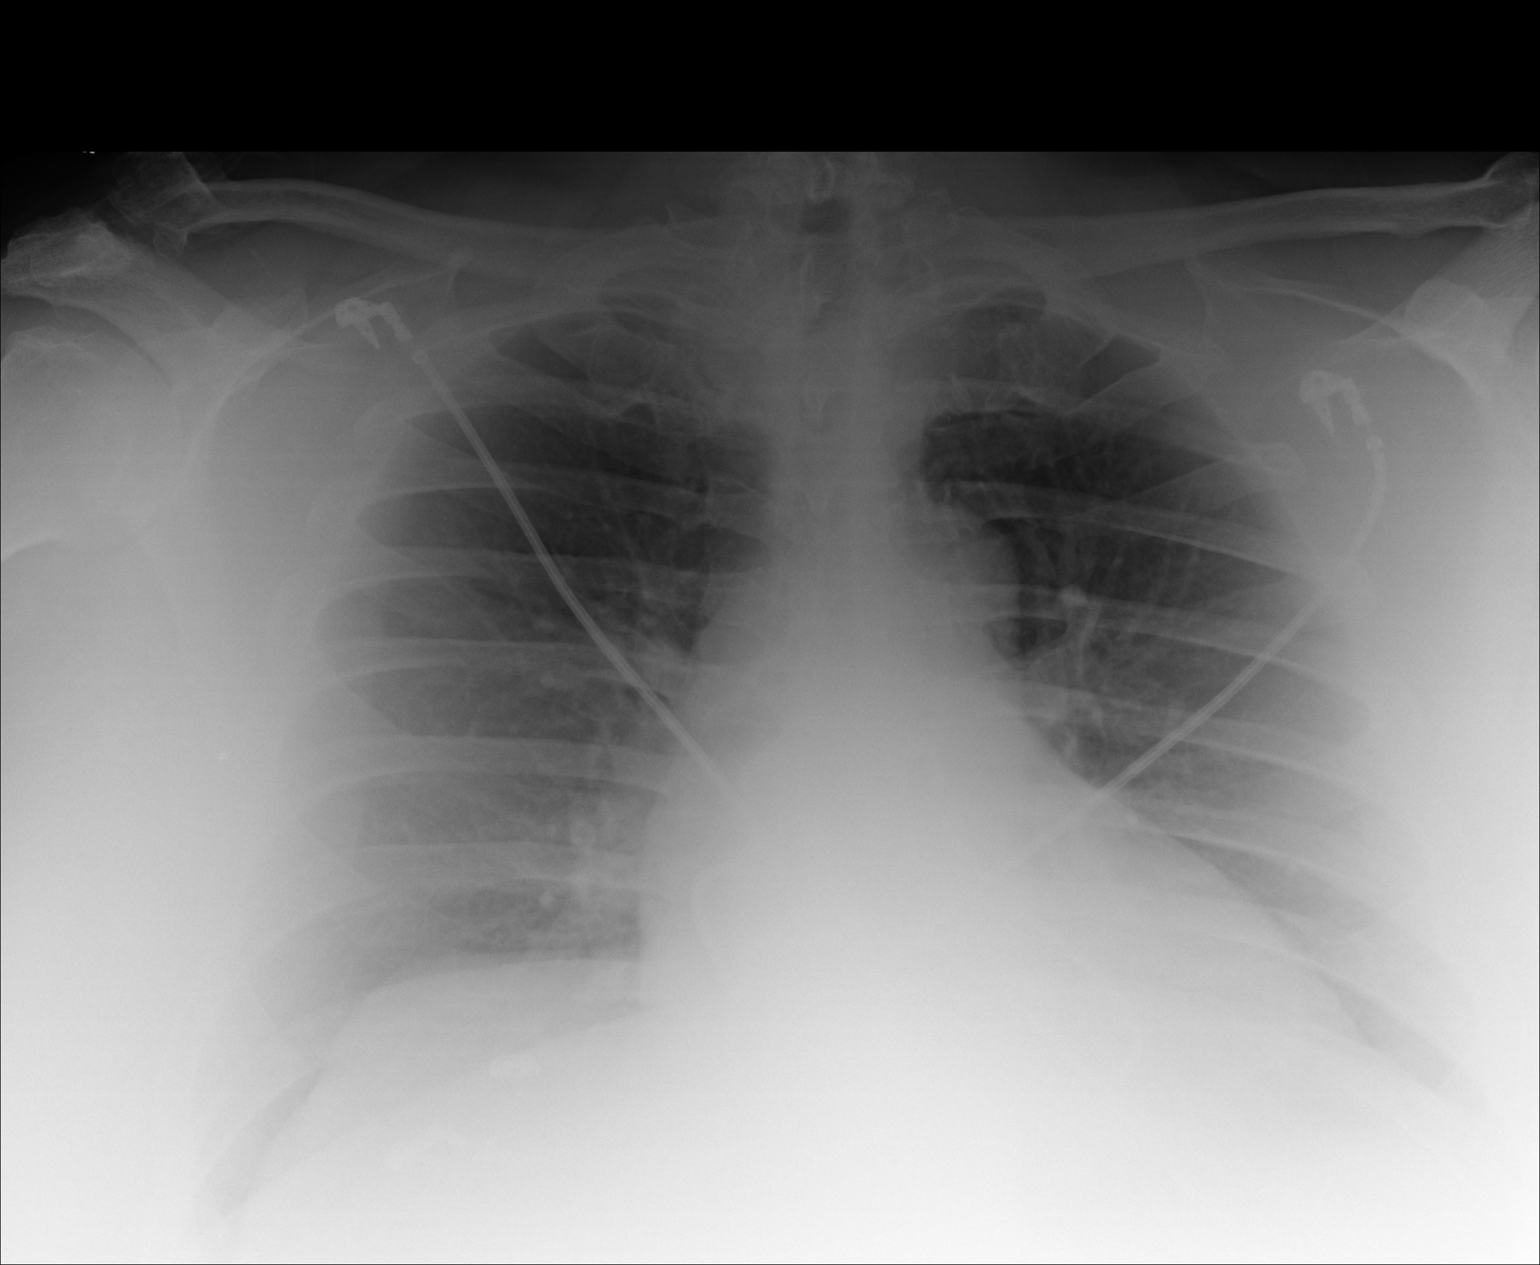

[1 of 1 positions shown; findings below may reference images not displayed]

PROCEDURE:     DXR - DXR PORTABLE CHEST SINGLE VIEW  - December 26, 2010  [DATE]

RESULT:     Comparison is made to the study of 04/16/2010.

A lordotic projection shows the lungs appear clear. Cardiac monitoring
electrodes are present. The heart and pulmonary vessels appear to be normal
for the projection. The lungs appear clear.
IMPRESSION: Lordotic projection. No acute cardiopulmonary disease
evident.

## 2011-10-27 ENCOUNTER — Ambulatory Visit (INDEPENDENT_AMBULATORY_CARE_PROVIDER_SITE_OTHER): Payer: BC Managed Care – PPO | Admitting: Internal Medicine

## 2011-10-27 DIAGNOSIS — Z7901 Long term (current) use of anticoagulants: Secondary | ICD-10-CM

## 2011-10-27 DIAGNOSIS — I4891 Unspecified atrial fibrillation: Secondary | ICD-10-CM

## 2011-10-27 DIAGNOSIS — R319 Hematuria, unspecified: Secondary | ICD-10-CM

## 2011-10-27 LAB — POCT URINALYSIS DIPSTICK
Bilirubin, UA: NEGATIVE
Ketones, UA: NEGATIVE
Spec Grav, UA: 1.015
pH, UA: 5.5

## 2011-10-27 LAB — POCT INR: INR: 2.4

## 2011-10-27 NOTE — Progress Notes (Signed)
Addended by: Rita Ohara R on: 10/27/2011 04:08 PM   Modules accepted: Orders

## 2011-10-27 NOTE — Patient Instructions (Signed)
  Latest dosing instructions   Total Sun Mon Tue Wed Thu Fri Sat   70 10 mg 10 mg 10 mg 10 mg 10 mg 10 mg 10 mg    (10 mg1) (10 mg1) (10 mg1) (10 mg1) (10 mg1) (10 mg1) (10 mg1)        

## 2011-11-01 ENCOUNTER — Telehealth: Payer: Self-pay | Admitting: Family Medicine

## 2011-11-01 NOTE — Telephone Encounter (Signed)
Error

## 2011-11-02 ENCOUNTER — Other Ambulatory Visit (INDEPENDENT_AMBULATORY_CARE_PROVIDER_SITE_OTHER): Payer: BC Managed Care – PPO

## 2011-11-02 DIAGNOSIS — R3129 Other microscopic hematuria: Secondary | ICD-10-CM

## 2011-11-03 LAB — URINALYSIS, ROUTINE W REFLEX MICROSCOPIC
Glucose, UA: NEGATIVE mg/dL
Leukocytes, UA: NEGATIVE
Nitrite: NEGATIVE
Protein, ur: NEGATIVE mg/dL
Urobilinogen, UA: 0.2 mg/dL (ref 0.0–1.0)

## 2011-11-12 ENCOUNTER — Encounter: Payer: Self-pay | Admitting: Cardiovascular Disease

## 2011-11-12 ENCOUNTER — Emergency Department: Payer: Self-pay | Admitting: Emergency Medicine

## 2011-11-12 LAB — BASIC METABOLIC PANEL
Anion Gap: 10 (ref 7–16)
BUN: 12 mg/dL (ref 7–18)
Creatinine: 1.13 mg/dL (ref 0.60–1.30)
EGFR (African American): >60
EGFR (Non-African Amer.): >60
Glucose: 120 mg/dL — ABNORMAL HIGH (ref 65–99)
Sodium: 141 mmol/L (ref 136–145)

## 2011-11-12 LAB — CBC
HGB: 14.7 g/dL (ref 13.0–18.0)
MCH: 27.8 pg (ref 26.0–34.0)
MCHC: 33 g/dL (ref 32.0–36.0)
RBC: 5.29 10*6/uL (ref 4.40–5.90)
WBC: 11.1 10*3/uL — ABNORMAL HIGH (ref 3.8–10.6)

## 2011-11-12 LAB — CK TOTAL AND CKMB (NOT AT ARMC): CK-MB: 1.5 ng/mL (ref 0.5–3.6)

## 2011-11-12 LAB — TROPONIN I
Troponin-I: <0.02 ng/mL
Troponin-I: <0.02 ng/mL

## 2011-11-12 LAB — PROTIME-INR
INR: 2.2
Prothrombin Time: 24.4 secs — ABNORMAL HIGH (ref 11.5–14.7)

## 2011-11-18 ENCOUNTER — Inpatient Hospital Stay: Payer: Self-pay | Admitting: Internal Medicine

## 2011-11-18 LAB — DRUG SCREEN, URINE
Amphetamines, Ur Screen: NEGATIVE (ref ?–1000)
Benzodiazepine, Ur Scrn: POSITIVE (ref ?–200)
Cannabinoid 50 Ng, Ur ~~LOC~~: NEGATIVE (ref ?–50)
Methadone, Ur Screen: NEGATIVE (ref ?–300)
Opiate, Ur Screen: NEGATIVE (ref ?–300)
Phencyclidine (PCP) Ur S: NEGATIVE (ref ?–25)

## 2011-11-18 LAB — COMPREHENSIVE METABOLIC PANEL
Anion Gap: 11 (ref 7–16)
BUN: 13 mg/dL (ref 7–18)
Bilirubin,Total: 0.2 mg/dL (ref 0.2–1.0)
Chloride: 103 mmol/L (ref 98–107)
Creatinine: 1.33 mg/dL — ABNORMAL HIGH (ref 0.60–1.30)
EGFR (African American): >60
EGFR (Non-African Amer.): 59 — ABNORMAL LOW
Osmolality: 283 (ref 275–301)
Potassium: 3.7 mmol/L (ref 3.5–5.1)
SGOT(AST): 28 U/L (ref 15–37)
Sodium: 141 mmol/L (ref 136–145)
Total Protein: 7.1 g/dL (ref 6.4–8.2)

## 2011-11-18 LAB — CBC
HCT: 45.4 % (ref 40.0–52.0)
MCH: 27.6 pg (ref 26.0–34.0)
MCV: 85 fL (ref 80–100)
Platelet: 155 10*3/uL (ref 150–440)
RBC: 5.32 10*6/uL (ref 4.40–5.90)
RDW: 16.7 % — ABNORMAL HIGH (ref 11.5–14.5)

## 2011-11-18 LAB — CK TOTAL AND CKMB (NOT AT ARMC): CK-MB: 1.5 ng/mL (ref 0.5–3.6)

## 2011-11-18 LAB — PROTIME-INR
INR: 2.8
Prothrombin Time: 29.9 secs — ABNORMAL HIGH (ref 11.5–14.7)

## 2011-11-18 LAB — TSH: Thyroid Stimulating Horm: 2.23 u[IU]/mL

## 2011-11-19 DIAGNOSIS — R000 Tachycardia, unspecified: Secondary | ICD-10-CM

## 2011-11-19 LAB — BASIC METABOLIC PANEL
Anion Gap: 10 (ref 7–16)
BUN: 12 mg/dL (ref 7–18)
Co2: 28 mmol/L (ref 21–32)
Creatinine: 1 mg/dL (ref 0.60–1.30)
EGFR (African American): >60
EGFR (Non-African Amer.): >60
Glucose: 105 mg/dL — ABNORMAL HIGH (ref 65–99)
Osmolality: 293 (ref 275–301)
Sodium: 147 mmol/L — ABNORMAL HIGH (ref 136–145)

## 2011-11-19 LAB — CK TOTAL AND CKMB (NOT AT ARMC)
CK, Total: 236 U/L — ABNORMAL HIGH (ref 35–232)
CK, Total: 240 U/L — ABNORMAL HIGH (ref 35–232)
CK-MB: 1.5 ng/mL (ref 0.5–3.6)
CK-MB: 1.7 ng/mL (ref 0.5–3.6)

## 2011-11-19 LAB — TROPONIN I
Troponin-I: 0.07 ng/mL — ABNORMAL HIGH
Troponin-I: 0.05 ng/mL

## 2011-11-19 LAB — LIPID PANEL
Cholesterol: 104 mg/dL (ref 0–200)
Ldl Cholesterol, Calc: 47 mg/dL (ref 0–100)

## 2011-11-20 ENCOUNTER — Telehealth: Payer: Self-pay | Admitting: *Deleted

## 2011-11-20 NOTE — Telephone Encounter (Signed)
Pt says he came by office this AM with s/e's of medication, and you had told him to track his HR and take his "Amiodarone? Possibly" with food. I reviewed his medlist, he says not on Flecainide anymore, but I do not see Amiodarone and unsure when this was started? He reports HR of 47-51 all day. He had to go since he was at work, and asked that I call back. I have left a msg and notified him I will send note to Dr. Mariah Milling to see what is advised.

## 2011-11-20 NOTE — Telephone Encounter (Signed)
We started amio 400 bid in the hospital for atrial flutter He can take 200 BID if he has bradycardia Follow up next week

## 2011-11-21 NOTE — Telephone Encounter (Signed)
Unable to reach pt on few attempts, left msg on his vm with info below. Pt has f/u with TG next week. Asked pt to call back with any questions.

## 2011-11-22 ENCOUNTER — Encounter: Payer: BC Managed Care – PPO | Admitting: Cardiovascular Disease

## 2011-11-29 ENCOUNTER — Ambulatory Visit (INDEPENDENT_AMBULATORY_CARE_PROVIDER_SITE_OTHER): Payer: BC Managed Care – PPO | Admitting: Cardiovascular Disease

## 2011-11-29 ENCOUNTER — Encounter: Payer: Self-pay | Admitting: Cardiovascular Disease

## 2011-11-29 VITALS — BP 126/89 | HR 58 | Ht 74.0 in | Wt >= 6400 oz

## 2011-11-29 DIAGNOSIS — E785 Hyperlipidemia, unspecified: Secondary | ICD-10-CM

## 2011-11-29 DIAGNOSIS — I4891 Unspecified atrial fibrillation: Secondary | ICD-10-CM

## 2011-11-29 DIAGNOSIS — I10 Essential (primary) hypertension: Secondary | ICD-10-CM

## 2011-11-29 MED ORDER — AMIODARONE HCL 200 MG PO TABS: 200.0000 mg | ORAL_TABLET | Freq: Two times a day (BID) | ORAL | Status: DC

## 2011-11-29 NOTE — Assessment & Plan Note (Signed)
EKG from [facility] last week showed 1:1 atrial flutter requiring cardioversion. At his request, we changed his flecainide to amiodarone and currently he takes 200 mg b.i.d.. We will decrease this to 200 mg daily in one week's time.  If he has additional episodes of atrial flutter, we would offer ablation ( uncertain if the cardiac catheterization table could handle his weight).

## 2011-11-29 NOTE — Progress Notes (Signed)
Patient ID: Steven Beltran, male    DOB: 1956/03/31, 56 y.o.   MRN: [id number]  HPI Comments: 56 yo with history paroxysmal atrial fibrillation, Morbid obesity, OSA on CPAP, ED,  presents for followup.   Possible previous SVT, converted with adenosine.  No prior ablation. He presented to the hospital recently  On November 18, 2011 with atrial flutter. He was taking flecainide and low-dose metoprolol at the time. He was given adenosine with no conversion of his rhythm. Heart rate was greater than 200 with one-to-one flutter.  He required cardioversion in the emergency room. INR was therapeutic.   He was started on amiodarone 400 mg b.i.d. For several days, now 200 mg b.i.d. For the past week.  No further palpitations or tachycardia. He is taking metoprolol tartrate 12.5 mg b.i.d..   His weight has been an issue.  he is now over 400 pounds.      Myoview with  diaphragmatic attenuation Versus ischemia Previous catheterization  Showed no obstructive coronary disease.  EKG shows normal sinus rhythm with rate 58 beats per minute, T-wave abnormality in leads V1 through V6     Outpatient Encounter Prescriptions as of 11/29/2011  Medication Sig Dispense Refill  . amiodarone (PACERONE) 200 MG tablet Take 1 tablet (200 mg total) by mouth 2 (two) times daily.  60 tablet  3  . losartan (COZAAR) 25 MG tablet Take 1 tablet (25 mg total) by mouth daily.  30 tablet  6  . metFORMIN (GLUCOPHAGE) 500 MG tablet Take 1.5 tablets (750 mg total) by mouth 2 (two) times daily with a meal.  90 tablet  3  . metoprolol tartrate (LOPRESSOR) 25 MG tablet Take 0.5 tablets (12.5 mg total) by mouth 2 (two) times daily.  60 tablet  6  . pravastatin (PRAVACHOL) 40 MG tablet Take 1 tablet (40 mg total) by mouth daily.  30 tablet  4  . tadalafil (CIALIS) 5 MG tablet Take 5 mg by mouth daily as needed. 30 min prior to activity       . warfarin (COUMADIN) 10 MG tablet TAKE 1 TABLET BY MOUTH ONCE DAILY AS DIRECTED BY COUMADIN CLINIC   90 tablet  0      Review of Systems  HENT: Negative.   Eyes: Negative.   Respiratory: Negative.   Cardiovascular: Negative.   Gastrointestinal: Negative.   Musculoskeletal: Negative.   Skin: Negative.   Neurological:       " off center"  Hematological: Negative.   Psychiatric/Behavioral: Positive for sleep disturbance.  All other systems reviewed and are negative.    BP 126/89  Pulse 58  Ht 6\' 2"  (1.88 m)  Wt 401 lb 12.8 oz (182.255 kg)  BMI 51.59 kg/m2  Physical Exam  Nursing note and vitals reviewed. Constitutional: He is oriented to person, place, and time. He appears well-developed and well-nourished.       Morbidly obese  HENT:  Head: Normocephalic.  Nose: Nose normal.  Mouth/Throat: Oropharynx is clear and moist.  Eyes: Conjunctivae are normal. Pupils are equal, round, and reactive to light.  Neck: Normal range of motion. Neck supple. No JVD present.  Cardiovascular: Normal rate, regular rhythm, S1 normal, S2 normal, normal heart sounds and intact distal pulses.  Exam reveals no gallop and no friction rub.   No murmur heard. Pulmonary/Chest: Effort normal and breath sounds normal. No respiratory distress. He has no wheezes. He has no rales. He exhibits no tenderness.  Abdominal: Soft. Bowel sounds are normal. He exhibits no  distension. There is no tenderness.  Musculoskeletal: Normal range of motion. He exhibits edema. He exhibits no tenderness.  Lymphadenopathy:    He has no cervical adenopathy.  Neurological: He is alert and oriented to person, place, and time. Coordination normal.  Skin: Skin is warm and dry. No rash noted. No erythema.  Psychiatric: He has a normal mood and affect. His behavior is normal. Judgment and thought content normal.           Assessment and Plan

## 2011-11-29 NOTE — Patient Instructions (Signed)
You are doing well. Please take amiodarone twice a day until next Wednesday 12/05/10. Then go down to one amiodarone a day with metoprolol  For rapid heart rate, take two amiodarone and one metoprolol  Call the office.  Please call us if you have new issues that need to be addressed before your next appt.  Your physician wants you to follow-up in: 3 months.  You will receive a reminder letter in the mail two months in advance. If you don't receive a letter, please call our office to schedule the follow-up appointment.

## 2011-11-29 NOTE — Assessment & Plan Note (Signed)
Blood pressure is well controlled on today's visit. No changes made to the medications. 

## 2011-11-29 NOTE — Assessment & Plan Note (Signed)
We have encouraged continued exercise, careful diet management in an effort to lose weight. He would benefit from gastric bypass.

## 2011-11-29 NOTE — Assessment & Plan Note (Signed)
We have suggested he continue his pravastatin.

## 2011-11-30 ENCOUNTER — Other Ambulatory Visit: Payer: Self-pay | Admitting: Internal Medicine

## 2011-11-30 MED ORDER — METFORMIN HCL 500 MG PO TABS: 750.0000 mg | ORAL_TABLET | Freq: Two times a day (BID) | ORAL | Status: DC

## 2011-11-30 NOTE — Telephone Encounter (Signed)
colchicine is not on med list

## 2011-11-30 NOTE — Telephone Encounter (Signed)
Ok to refill metformin and colchicine 0.6 mg  #30 one po qd prn, one refill

## 2011-11-30 NOTE — Telephone Encounter (Signed)
Pt needs  refill on metformin 500mg  twice a day pt is out. Pt would like a refill also on colchine for gout call [facility] [place] [phone]

## 2011-12-01 ENCOUNTER — Telehealth: Payer: Self-pay | Admitting: Internal Medicine

## 2011-12-01 MED ORDER — COLCHICINE 0.6 MG PO TABS: 0.6000 mg | ORAL_TABLET | Freq: Every day | ORAL | Status: AC | PRN

## 2011-12-01 NOTE — Telephone Encounter (Signed)
rx sent in electronically 

## 2011-12-01 NOTE — Telephone Encounter (Signed)
Patient states he has yet to get his refill for colchicine. Please assist and inform patient when complete.

## 2011-12-01 NOTE — Telephone Encounter (Signed)
Addended by: Alfred Levins D on: 12/01/2011 07:53 AM   Modules accepted: Orders

## 2011-12-01 NOTE — Telephone Encounter (Signed)
rx sent in this morning, pt aware

## 2011-12-26 ENCOUNTER — Telehealth: Payer: Self-pay | Admitting: Internal Medicine

## 2011-12-26 ENCOUNTER — Other Ambulatory Visit: Payer: Self-pay | Admitting: Cardiovascular Disease

## 2011-12-26 MED ORDER — WARFARIN SODIUM 10 MG PO TABS: 10.0000 mg | ORAL_TABLET | Freq: Every day | ORAL | Status: DC

## 2011-12-26 MED ORDER — AMIODARONE HCL 200 MG PO TABS: 200.0000 mg | ORAL_TABLET | Freq: Two times a day (BID) | ORAL | Status: DC

## 2011-12-26 NOTE — Telephone Encounter (Signed)
rx sent in electronically 

## 2011-12-26 NOTE — Telephone Encounter (Signed)
Patient called stating that his rx has expired on his warfin. Please assist.

## 2012-01-17 ENCOUNTER — Other Ambulatory Visit: Payer: Self-pay | Admitting: *Deleted

## 2012-01-17 MED ORDER — LOSARTAN POTASSIUM 25 MG PO TABS: 25.0000 mg | ORAL_TABLET | Freq: Every day | ORAL | Status: AC

## 2012-02-03 ENCOUNTER — Other Ambulatory Visit: Payer: Self-pay | Admitting: Cardiology

## 2012-02-05 NOTE — Telephone Encounter (Signed)
..   Requested Prescriptions   Pending Prescriptions Disp Refills  . pravastatin (PRAVACHOL) 40 MG tablet [Pharmacy Med Name: PRAVASTATIN SODIUM 40 MG TAB] 30 tablet 1    Sig: TAKE 1 TABLET (40 MG TOTAL) BY MOUTH DAILY.

## 2012-03-23 ENCOUNTER — Other Ambulatory Visit: Payer: Self-pay | Admitting: Internal Medicine

## 2012-03-25 ENCOUNTER — Ambulatory Visit: Payer: Self-pay | Admitting: Cardiovascular Disease

## 2012-03-25 DIAGNOSIS — Z7901 Long term (current) use of anticoagulants: Secondary | ICD-10-CM

## 2012-03-25 DIAGNOSIS — I4891 Unspecified atrial fibrillation: Secondary | ICD-10-CM

## 2012-03-29 ENCOUNTER — Ambulatory Visit: Payer: BC Managed Care – PPO | Admitting: Internal Medicine

## 2012-03-29 DIAGNOSIS — Z0289 Encounter for other administrative examinations: Secondary | ICD-10-CM

## 2012-04-07 ENCOUNTER — Other Ambulatory Visit: Payer: Self-pay | Admitting: Cardiology

## 2012-04-18 ENCOUNTER — Other Ambulatory Visit: Payer: Self-pay | Admitting: Internal Medicine

## 2012-04-24 ENCOUNTER — Ambulatory Visit (INDEPENDENT_AMBULATORY_CARE_PROVIDER_SITE_OTHER): Payer: BC Managed Care – PPO

## 2012-04-24 VITALS — BP 130/86 | HR 60 | Ht 74.0 in | Wt 344.8 lb

## 2012-04-24 DIAGNOSIS — I4892 Unspecified atrial flutter: Secondary | ICD-10-CM

## 2012-04-24 DIAGNOSIS — R450 Nervousness: Secondary | ICD-10-CM

## 2012-04-24 NOTE — Progress Notes (Signed)
Pt walked in with c/o feeling "jittery" this am at 1000 prior to taking meds.  He remembered meds and took these.  He now says "jittery" feeling is gone but episode this am "scared" him.  He denies sob or chest pain  BP checked and EKG performed.  Will review with Dr. Mariah Milling.  Reviewed EKG with Dr. Mariah Milling. Sinus arrythmia.  Will make no changes to present regimen. F/U as planned.

## 2012-04-24 NOTE — Patient Instructions (Addendum)
Resume present regimen. No changes.

## 2012-05-07 ENCOUNTER — Other Ambulatory Visit: Payer: Self-pay | Admitting: *Deleted

## 2012-05-07 MED ORDER — METOPROLOL TARTRATE 25 MG PO TABS: 12.5000 mg | ORAL_TABLET | Freq: Two times a day (BID) | ORAL | Status: AC

## 2012-05-07 NOTE — Telephone Encounter (Signed)
Refilled Metoprolol

## 2012-05-13 ENCOUNTER — Telehealth: Payer: Self-pay | Admitting: Cardiovascular Disease

## 2012-05-13 NOTE — Telephone Encounter (Signed)
Pt informed Understanding verb 

## 2012-05-13 NOTE — Telephone Encounter (Signed)
Unfortunately, don't know many doctors there [place], [employer] [facility] has excellent cardiovascular physicians

## 2012-05-13 NOTE — Telephone Encounter (Signed)
See below and advise. Thanks ?

## 2012-05-13 NOTE — Telephone Encounter (Signed)
Pt movign to [place] and wants to know if there is a someone that you could recommend there.

## 2012-05-15 ENCOUNTER — Encounter: Payer: BC Managed Care – PPO | Admitting: Family

## 2012-05-15 DIAGNOSIS — Z0289 Encounter for other administrative examinations: Secondary | ICD-10-CM

## 2012-05-16 ENCOUNTER — Telehealth: Payer: Self-pay | Admitting: Family

## 2012-05-16 NOTE — Telephone Encounter (Signed)
Called and lft vm for pt to rsc coumadin lab as noted.

## 2012-05-16 NOTE — Telephone Encounter (Signed)
Missed his coumadin appointment yesterday. Please schedule

## 2012-05-22 NOTE — Telephone Encounter (Signed)
Called pt re: sch coumadin lab. Pt said that he is out of town and is unsure when he will be returning. Pt said that he will call back and sch later.

## 2012-05-27 NOTE — Telephone Encounter (Signed)
I will contact him on Friday to follow up

## 2012-06-17 ENCOUNTER — Telehealth: Payer: Self-pay | Admitting: Family

## 2012-06-17 NOTE — Telephone Encounter (Signed)
Schedule INR visit ASAP. Thank you

## 2012-06-18 NOTE — Telephone Encounter (Signed)
Called and lft vm for pt to sch inr as noted.

## 2012-06-28 ENCOUNTER — Telehealth: Payer: Self-pay | Admitting: Family

## 2012-06-28 NOTE — Telephone Encounter (Signed)
Call and cancel refills at the pharmacy on Coumadin. No OV since June. Continuing to prescribe is unsafe

## 2012-07-01 NOTE — Telephone Encounter (Signed)
No refills were given with 03/23/2012 refill. Padonda aware

## 2012-07-08 ENCOUNTER — Other Ambulatory Visit: Payer: Self-pay | Admitting: Internal Medicine

## 2012-07-09 ENCOUNTER — Telehealth: Payer: Self-pay | Admitting: Internal Medicine

## 2012-07-09 MED ORDER — WARFARIN SODIUM 10 MG PO TABS: 10.0000 mg | ORAL_TABLET | Freq: Every day | ORAL | Status: DC

## 2012-07-09 NOTE — Telephone Encounter (Signed)
Pt called and has sch an ov for 07/24/12 at 3:30 pm and is needing a refill of warfarin to last until appt date. Pt works out of town and this is the earliest date pt had avail for ov. [facility] [address] [facility] [place].

## 2012-07-09 NOTE — Telephone Encounter (Signed)
Padonda will not refill b/c pt has no showed to his last 3 appts.  Dr Artist Pais called cardiology to find out protocol for this situation.  Per Dr Artist Pais call in enough to get pt to his appt and inform pt that if it was better for pt to go to [facility] to get his PT than he should go there.  Ok to give pt enough till next appt but inform pt that if he does not show up for coumadin appts than we can't give him anymore refills.  Left message for pt to call back

## 2012-07-12 ENCOUNTER — Telehealth: Payer: Self-pay

## 2012-07-12 MED ORDER — WARFARIN SODIUM 10 MG PO TABS: 10.0000 mg | ORAL_TABLET | Freq: Every day | ORAL | Status: AC

## 2012-07-12 NOTE — Telephone Encounter (Signed)
Message copied by Marcelle Overlie on Fri Jul 12, 2012  9:24 AM ------      Message from: Antonieta Iba      Created: Thu Jul 11, 2012  5:16 PM       He needs followup in clinic to discuss this and other issues.      May need to adjust amiodarone and other medications.      Birney Belshe, can schedule him for a appointment?      Thanks      Tim            ----- Message -----         From: Mariane Masters, PHARMD         Sent: 07/09/2012   4:25 PM           To: Meda Coffee, DO, Antonieta Iba, MD            I received a call today from Dr. Artist Pais regarding Mr. Virden and being noncompliant with Coumadin.  He wanted to get your opinion on looking into starting a new anticoag agent.  I told him I would send you a message since I am not familiar with the patient.  Thanks for your input.

## 2012-07-12 NOTE — Telephone Encounter (Signed)
Pt says he lives in [place], [place] now and only comes home on w/e.  He is unable to come see [place].  He has appt with Dr. Gertie Exon (cardiologist) at [facility] 07/24/12.  He has been unable to find a PCP in a timely manner Pt is out of Coumadin and called Dr. Artist Pais for a refill. They refused to refill d/t inability to have INR checked since [place]. Pt cannot find anyone to see him soon enough to get established in a coumadin clinic. I will call Dr. Gertie Exon' office to see if they have a coumadin clinic/can get him in next week. I will call pt back Understanding verb.

## 2012-07-12 NOTE — Telephone Encounter (Signed)
LMOM re: Dr. Windell Hummingbird instruction to refill warfarin and have pt keep f/u with Dr. Al Corpus. RX sent to local pharmacy

## 2012-07-12 NOTE — Telephone Encounter (Signed)
LMTCB

## 2012-07-12 NOTE — Telephone Encounter (Signed)
I spoke with Dr. Dalbert Batman office at [facility]. They transferred me to coumadin clinic [phone]. I spoke with scheduling dept at coumadin clinic who says they cannot see pt until 07/29/12. They went ahead and made appt for pt 10/7 at 1130. I discussed with Dr. Mariah Milling who says ok to go ahead and refill coumadin until he sees [place] 10/2 as scheduled. I will call pt to inform.

## 2012-07-24 ENCOUNTER — Ambulatory Visit: Payer: BC Managed Care – PPO | Admitting: Internal Medicine

## 2012-07-24 DIAGNOSIS — Z0289 Encounter for other administrative examinations: Secondary | ICD-10-CM

## 2012-08-21 ENCOUNTER — Other Ambulatory Visit: Payer: Self-pay | Admitting: *Deleted

## 2012-08-21 MED ORDER — AMIODARONE HCL 200 MG PO TABS: 200.0000 mg | ORAL_TABLET | Freq: Every day | ORAL | Status: AC

## 2012-08-21 NOTE — Telephone Encounter (Signed)
Refilled Amiodarone. 

## 2012-09-02 ENCOUNTER — Other Ambulatory Visit: Payer: Self-pay | Admitting: Internal Medicine

## 2012-09-09 ENCOUNTER — Other Ambulatory Visit: Payer: Self-pay | Admitting: Cardiovascular Disease

## 2012-09-13 IMAGING — CR DG CHEST 1V PORT
1 series · 2 of 2 positions shown · non-contrast
Comparison: none

REASON FOR EXAM: Chest Pain
COMMENTS:

PROCEDURE:     DXR - DXR PORTABLE CHEST SINGLE VIEW  - November 18, 2011  [DATE]
RESULT:     Comparison is made to a prior study dated 11/12/2011.
The patient has taken a shallow inspiration. With technique taken into
consideration there is no evidence of focal infiltrates, effusions or edema.

[Series 1: portable · 0.17mm/px · 2 of 2 slices shown]
[im 1/2]
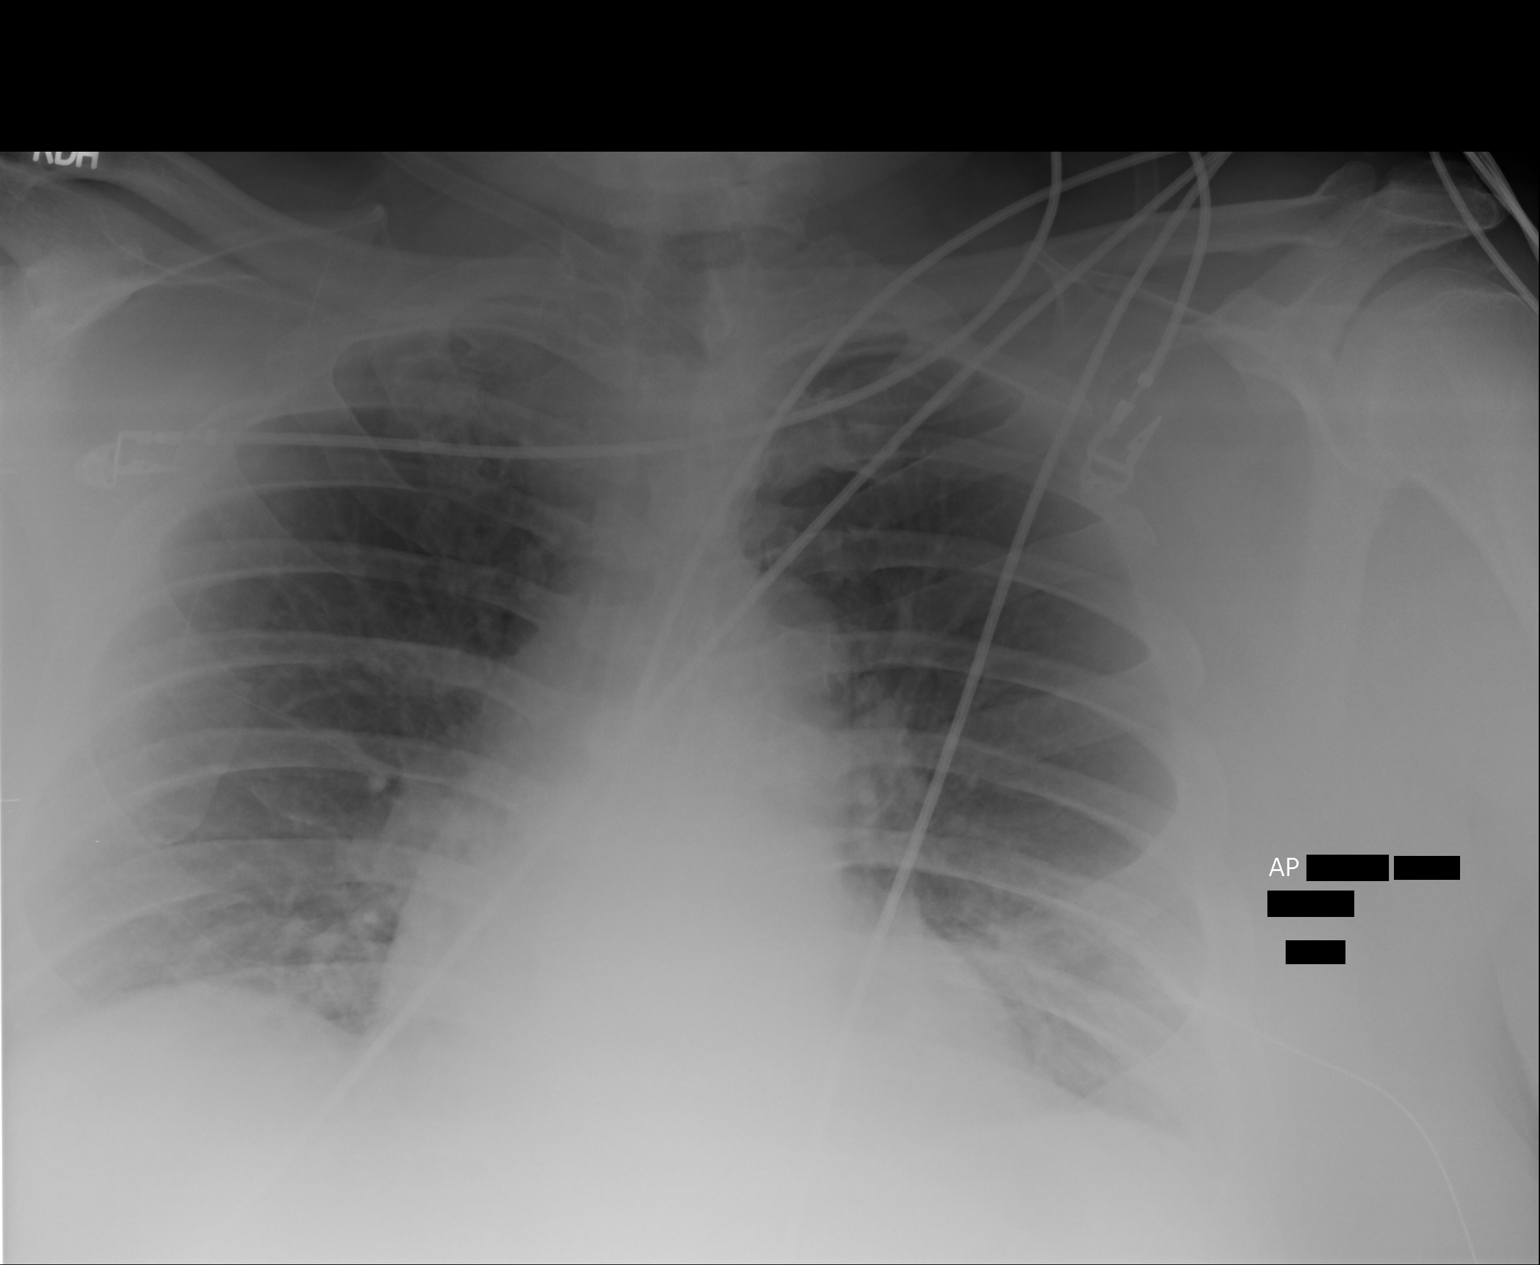
[im 2/2]
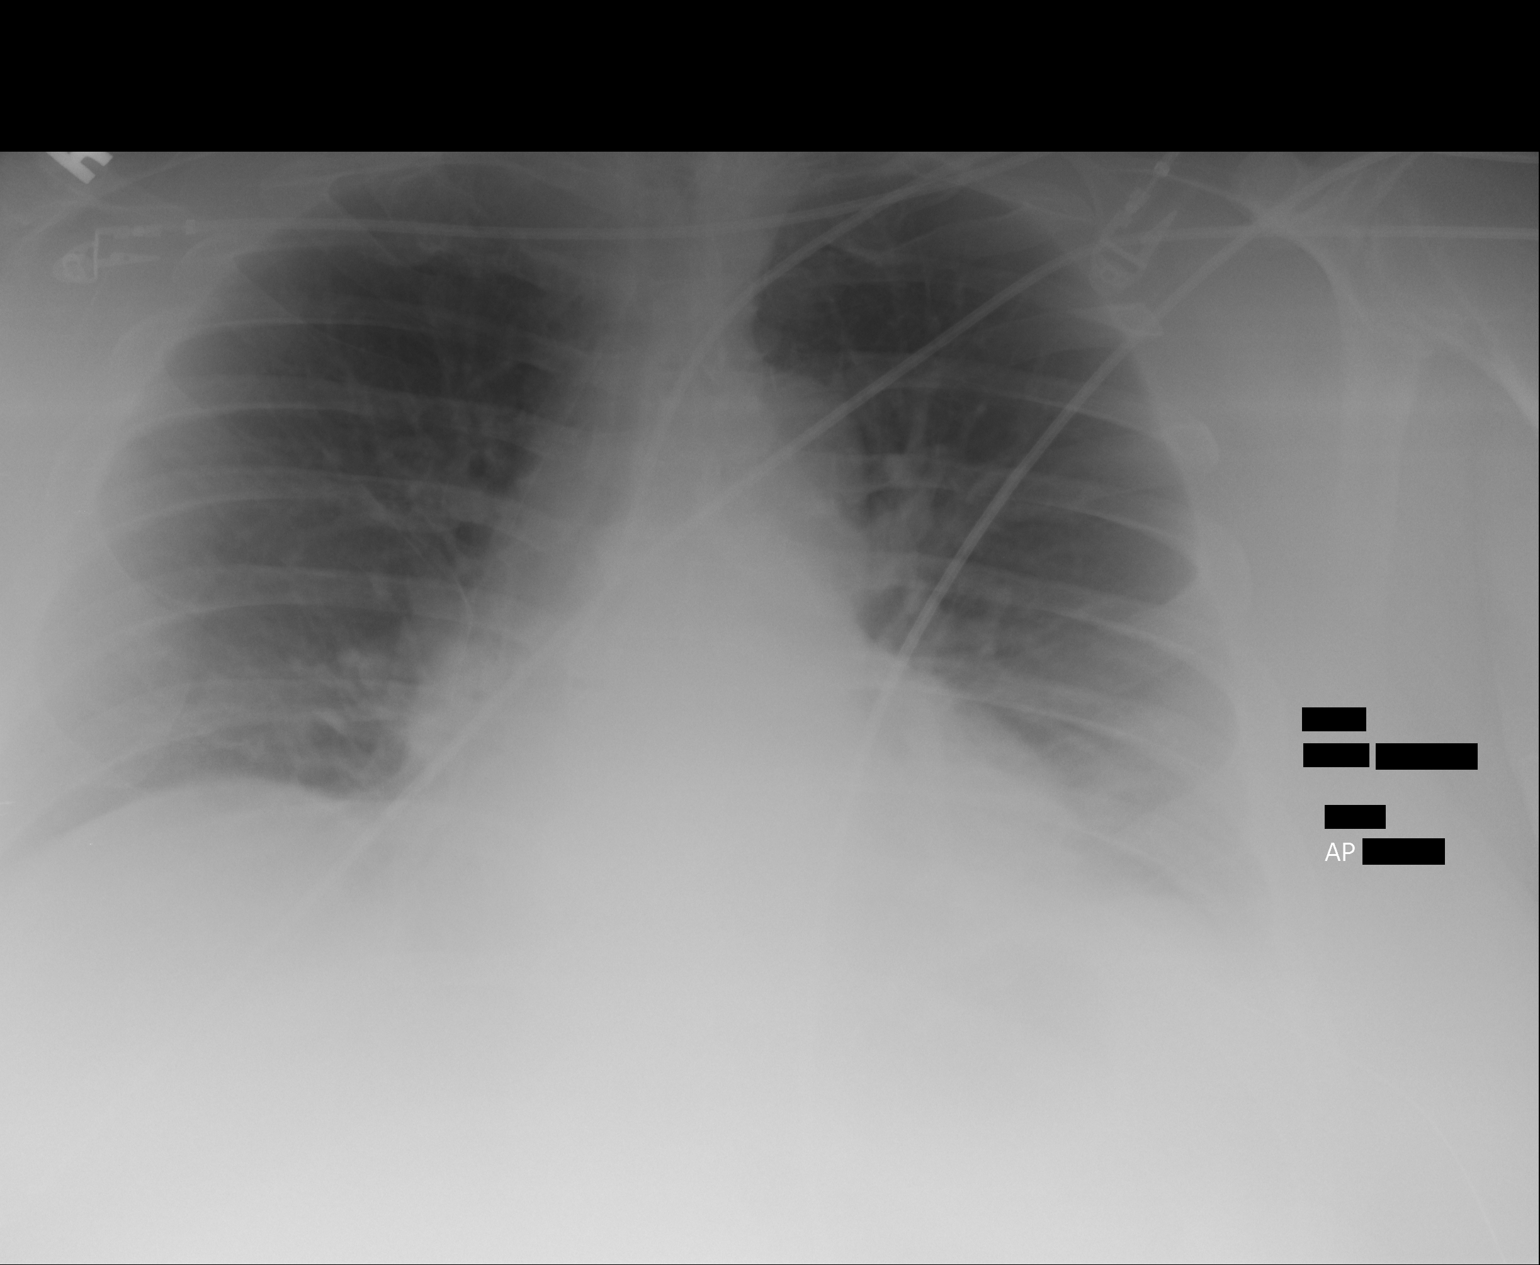

[2 of 2 positions shown; findings below may reference images not displayed]

IMPRESSION: Shallow respiration without evidence of acute
cardiopulmonary disease.

## 2012-10-11 ENCOUNTER — Other Ambulatory Visit: Payer: Self-pay | Admitting: Internal Medicine

## 2012-11-21 ENCOUNTER — Other Ambulatory Visit: Payer: Self-pay | Admitting: Cardiology

## 2012-11-21 ENCOUNTER — Other Ambulatory Visit: Payer: Self-pay | Admitting: Internal Medicine

## 2012-11-21 ENCOUNTER — Telehealth: Payer: Self-pay | Admitting: *Deleted

## 2012-11-21 NOTE — Telephone Encounter (Signed)
Patient has moved to [facility] and has established care there with a cardiologist. No refill sent in for patient. He will be following up with cardiologist where he lives.

## 2013-04-12 ENCOUNTER — Other Ambulatory Visit: Payer: Self-pay | Admitting: Cardiovascular Disease

## 2013-04-30 ENCOUNTER — Ambulatory Visit (INDEPENDENT_AMBULATORY_CARE_PROVIDER_SITE_OTHER): Payer: BC Managed Care – PPO | Admitting: General Practice

## 2013-05-01 ENCOUNTER — Other Ambulatory Visit: Payer: Self-pay

## 2014-10-18 ENCOUNTER — Emergency Department: Payer: Self-pay | Admitting: Internal Medicine

## 2014-10-18 LAB — URINALYSIS, COMPLETE
Bilirubin,UR: NEGATIVE
GLUCOSE, UR: NEGATIVE mg/dL (ref 0–75)
Ketone: NEGATIVE
LEUKOCYTE ESTERASE: NEGATIVE
Nitrite: NEGATIVE
Ph: 5 (ref 4.5–8.0)
Protein: NEGATIVE
RBC,UR: 3 /HPF (ref 0–5)
SPECIFIC GRAVITY: 1.013 (ref 1.003–1.030)
Squamous Epithelial: <1

## 2014-10-18 LAB — PROTIME-INR
INR: 1.8
PROTHROMBIN TIME: 20.2 s — AB (ref 11.5–14.7)

## 2014-10-18 LAB — CBC WITH DIFFERENTIAL/PLATELET
Basophil #: 0.1 10*3/uL (ref 0.0–0.1)
Basophil %: 0.7 %
Eosinophil #: 0.2 10*3/uL (ref 0.0–0.7)
Eosinophil %: 1.4 %
HCT: 43.4 % (ref 40.0–52.0)
HGB: 13.5 g/dL (ref 13.0–18.0)
Lymphocyte #: 2.6 10*3/uL (ref 1.0–3.6)
Lymphocyte %: 22 %
MCH: 25.8 pg — ABNORMAL LOW (ref 26.0–34.0)
MCHC: 31 g/dL — ABNORMAL LOW (ref 32.0–36.0)
MCV: 83 fL (ref 80–100)
Monocyte #: 1 x10 3/mm (ref 0.2–1.0)
Monocyte %: 8.3 %
Neutrophil #: 7.9 10*3/uL — ABNORMAL HIGH (ref 1.4–6.5)
Neutrophil %: 67.6 %
Platelet: 140 10*3/uL — ABNORMAL LOW (ref 150–440)
RBC: 5.21 10*6/uL (ref 4.40–5.90)
RDW: 16.9 % — ABNORMAL HIGH (ref 11.5–14.5)
WBC: 11.6 10*3/uL — ABNORMAL HIGH (ref 3.8–10.6)

## 2014-10-18 LAB — COMPREHENSIVE METABOLIC PANEL
Albumin: 3.4 g/dL (ref 3.4–5.0)
Alkaline Phosphatase: 71 U/L
Anion Gap: 9 (ref 7–16)
BUN: 15 mg/dL (ref 7–18)
Bilirubin,Total: 0.4 mg/dL (ref 0.2–1.0)
CALCIUM: 8.5 mg/dL (ref 8.5–10.1)
CO2: 23 mmol/L (ref 21–32)
CREATININE: 1.32 mg/dL — AB (ref 0.60–1.30)
Chloride: 109 mmol/L — ABNORMAL HIGH (ref 98–107)
EGFR (Non-African Amer.): 59 — ABNORMAL LOW
Glucose: 101 mg/dL — ABNORMAL HIGH (ref 65–99)
Osmolality: 282 (ref 275–301)
Potassium: 4.2 mmol/L (ref 3.5–5.1)
SGOT(AST): 23 U/L (ref 15–37)
SGPT (ALT): 34 U/L
SODIUM: 141 mmol/L (ref 136–145)
Total Protein: 6.9 g/dL (ref 6.4–8.2)

## 2014-10-18 LAB — LIPASE, BLOOD: Lipase: 93 U/L (ref 73–393)

## 2015-02-14 NOTE — Consult Note (Signed)
General Aspect 59 year old [place] American male with a history of atrial fibrillation/flutter on chronic Coumadin therapy who woke up from a nap this morning with malaise. Cardiology was consulted for tachycardia.  We felt a lump in his throat, no significant SOB or chest pain. He spent an hour working on the computer and the symptoms did not improve so he checked his heart rate on his treadmill and it was elevated.  He was also having some nausea.  In the ER,   his heart rate was 208 and his blood pressure was low at 86/59. He was given IV adenosine times three with no improvement and then underwent cardioversion with 100 joules after receiving 4 mg of Versed. He converted to sinus rhythm with first-degree AV block.  blood pressure was low  72/52, and was admitted for monitoring.    The patient states that he has had several occasions of this racing heart, the last was last Sunday. EKG last week showed atrial fibrillation with rate of 134 bpm.  It may have been precipitated by a lapse in medications. He had missed an entire 24 hours of medications due to forgetfulness and in the Emergency Room was treated with IV medications and sent home.  He states he has been on his medications. He had taken his doses this morning prior to the onset of this episode.    Present Illness . FAMILY HISTORY: Only notable for mother who had a heart attack at age 60.   SOCIAL HISTORY: He is a nonsmoker and nondrinker. He denies any illicit drug use.   Physical Exam:   GEN WD, WN, NAD    HEENT red conjunctivae    NECK supple  No masses    RESP normal resp effort  clear BS    CARD Regular rate and rhythm  No murmur    ABD denies tenderness  soft    LYMPH negative neck    EXTR negative edema    SKIN normal to palpation    NEURO motor/sensory function intact    PSYCH alert, A+O to time, place, person, good insight   Review of Systems:   Subjective/Chief Complaint Fullness in his chest, now  resolved    General: No Complaints    Skin: No Complaints    ENT: No Complaints    Eyes: No Complaints    Neck: No Complaints    Respiratory: No Complaints    Cardiovascular: Palpitations  fullness    Gastrointestinal: No Complaints    Genitourinary: No Complaints    Vascular: No Complaints    Musculoskeletal: No Complaints    Neurologic: No Complaints    Hematologic: No Complaints    Endocrine: No Complaints    Psychiatric: No Complaints    Review of Systems: All other systems were reviewed and found to be negative    Medications/Allergies Reviewed Medications/Allergies reviewed     SVT - Supraventricular Tachycardia:    Hyperlipidemia:    HTN:    diabetes:    a fib:    left knee surgery:        Admit Diagnosis:   ATRIAL FIB WITH RVR: 19-Nov-2011, Active, ATRIAL FIB WITH RVR      Chronic Problem:   Atrial fibrillation: (427.31) 18-Nov-2011, Active, ICD9, Atrial fibrillation  Home Medications:  Metoprolol Tartrate 25 mg oral tablet: 1/2 tablet 2 x day, Active  pravastatin:    , Active  flecainide:  orally , Active  Metformin hcl er 500 mg one twice day.: Active  Flecainide acetate 100 mg twice day.: Active  Losartan 25 mg one day.: Active  Coumadin 10 mg tablet: 1 tab(s) orally once a day x 30 days , Active    Cardiac:  26-Jan-13 15:25    Troponin I < 0.02   CK, Total 336   CPK-MB, Serum 1.5  Routine Coag:  26-Jan-13 15:25    Prothrombin 29.9   INR 2.8   Activated PTT (APTT) 45.6  Routine Hem:  26-Jan-13 15:25    WBC (CBC) 13.6   RBC (CBC) 5.32   Hemoglobin (CBC) 14.7   Hematocrit (CBC) 45.4   Platelet Count (CBC) 155   MCV 85   MCH 27.6   MCHC 32.3   RDW 16.7  Routine Chem:  26-Jan-13 15:25    Glucose, Serum 124   BUN 13   Creatinine (comp) 1.33   Sodium, Serum 141   Potassium, Serum 3.7   Chloride, Serum 103   CO2, Serum 27   Calcium (Total), Serum 8.7  Hepatic:  26-Jan-13 15:25    Bilirubin, Total 0.2   Alkaline  Phosphatase 57   SGPT (ALT) 31   SGOT (AST) 28   Total Protein, Serum 7.1   Albumin, Serum 3.5  Routine Chem:  26-Jan-13 15:25    Osmolality (calc) 283   eGFR (African American) >60   eGFR (Non-African American) 59   Anion Gap 11   Hemoglobin A1c ([facility]) 6.5  Thyroid:  26-Jan-13 15:25    Thyroid Stimulating Hormone 2.23  Cardiac:  27-Jan-13 07:07    Troponin I 0.05   CK, Total 240   CPK-MB, Serum 1.7   EKG:   Interpretation Initial EKG shows atrial flutter with rate of 200 bpm, RBBB, nonspecific ST ABN    Shellfish: SOB, Hives  Vital Signs/Nurse's Notes: **Vital Signs.:   27-Jan-13 09:54   Vital Signs Type Q 4hr   Temperature Temperature (F) 97.9   Celsius 36.6   Temperature Source oral   Pulse Pulse 59   Pulse source per Dinamap   Respirations Respirations 20   Systolic BP Systolic BP 650   Diastolic BP (mmHg) Diastolic BP (mmHg) 78   Mean BP 93   BP Source Dinamap   Pulse Ox % Pulse Ox % 98   Pulse Ox Activity Level  At rest   Oxygen Delivery Room Air/ 21 %     Impression 59 year old [place] American male with a history of atrial fibrillation/flutter on chronic Coumadin therapy who woke up from a nap this morning with malaise adn a "lump in his throat", found to be in atrial flutter with rate of 200 bpm in teh ER. Episode of atrial fibrillation last weekend.  A/P: 1) Atrial flutter Rapid rate on arrival. Reports he has been on his flecainide. --Would change flecainide to amiodarone 400 mg po BID for 4 days then decrease dose to 200 mg po BID. --We will see him in follow up in 1 to 2 weeks. We wil call him for appt. --Could continue low dose metoprolol, on warfarin  2) OSA: not wearing CPAP. Reports OSA may be improved per his finance. L:ess snoring.  3) HTN: Could continue on low dose losartan.  4)Hyperlipidemia: Would continue pravastatin   Electronic Signatures: Ida Rogue (MD)  (Signed 27-Jan-13 12:39)  Authored: General Aspect/Present  Illness, History and Physical Exam, Review of System, Past Medical History, Health Issues, Home Medications, Labs, EKG , Allergies, Vital Signs/Nurse's Notes, Impression/Plan   Last Updated: 27-Jan-13 12:39 by Ida Rogue (MD)

## 2015-02-14 NOTE — H&P (Signed)
PATIENT NAME:  Steven Beltran, Steven Beltran MR#:  [id number] DATE OF BIRTH:  [birth year]  DATE OF ADMISSION:  11/18/2011  HISTORY OF PRESENT ILLNESS: Steven Beltran is a very pleasant 59 year old African American male with a history of atrial fibrillation with prior ablations for supraventricular tachycardia on chronic Coumadin therapy who woke up from a nap this morning with a racing heart. He spent an hour working on a computer and the symptoms did not improve so he checked his heart rate on his treadmill and it was 130.  He was also having some nausea and shortness of breath but denies having any chest pressure. When he presented to the ED his heart rate was 208 and his blood pressure was Steven at 86/59. He was given IV adenosine times three with no improvement and then underwent cardioversion with 100 joules after receiving 4 mg of Versed. He converted to sinus rhythm with first-degree AV block, but was hypotensive with blood pressure as Steven as 72/52, and is being admitted for further evaluation and treatment. At the time of my evaluation the patient's blood pressure was 100/60. He was awake, conversant, and in sinus rhythm. The patient states that he has had several occasions of this racing heart. He was treated in the Emergency Room last Sunday with a similar episode, which was precipitated by a lapse in medications. He had missed an entire 24 hours of medications due to forgetfulness and in the Emergency Room was treated with IV medications and sent home. Today he states he has been on his medications. He had taken his doses this morning prior to the onset of this episode. He has not seen his cardiologist since before the new year but had seen his regular doctor in early January.   PRIMARY CARE PHYSICIAN:  Dr. Artist Pais at [facility] in [place].  CARDIOLOGISTS: He is followed by Dr. Sherryl Manges and Dr. [facility] in [place].   PAST MEDICAL HISTORY:  1. Atrial fibrillation with two prior  cardioversions.  2. Hyperlipidemia.  3. Hypertension.  4. Obesity.  5. Diabetes mellitus.   MEDICATIONS:  1. Pravastatin, dose unknown.  2. Metoprolol tartrate 25 mg, 1/2 tablet twice daily.  3. Metformin ER 500 mg daily.  4. Losartan 25 mg daily.  5. Flecainide 100 mg daily.  6. Coumadin 10 mg daily.   PAST SURGICAL HISTORY:  1. Arthroscopic repair of a torn ligament in his knee remotely.  2. Cardiac catheterization in 2011 after his admission yielded a positive Myoview. Per patient the cardiac catheterization was normal. This was done by [facility] cardiology.  ALLERGIES:  He has no known drug allergies.   LAST HOSPITALIZATION:  June 2011 for supraventricular tachycardia, which was treated with carotid massage and metoprolol. Again, he had an abnormal Myoview and underwent outpatient cardiac catheterization, which was reportedly normal.   FAMILY HISTORY: Only notable for mother who had a heart attack at age 17.   SOCIAL HISTORY: He is a nonsmoker and nondrinker. He denies any illicit drug use.   REVIEW OF SYSTEMS: Negative for fever, fatigue, weakness, and weight changes. HEENT: No blurred or double vision. No hearing loss or seasonal rhinitis. No difficulty swallowing or sinus pain. No cough, wheezing, hemoptysis, dyspnea, or painful respirations. No history of chronic obstructive pulmonary disease. He denies chest pain, orthopnea, and edema. He does have palpitations frequently. No history of syncope. He has no history of nausea, vomiting, diarrhea, or abdominal pain. He had an episode of hematuria which resolved with a temporary suspension  of Coumadin and has had no urologic work-up. He does have a history of polyuria and nocturia times three. He denies any history of anemia or bleeding despite Coumadin therapy,  and except for the hematuria no other issues. He does have some chronic knee pain. He denies numbness, weakness, dysarthria, or headaches. He has no history of anxiety, insomnia,  or depression.   PHYSICAL EXAMINATION:  GENERAL: This is a comfortable appearing middle-aged male in no apparent distress.   VITAL SIGNS:  Current heart rate is in the 60s. Blood pressure 100/60, respirations 18. He is sating 100% on 2 liters.   HEENT: Pupils are equal, round, and reactive to light. Sclerae are anicteric. Oropharynx is benign.  NECK: Supple without lymphadenopathy, JVD, thyromegaly, or carotid bruits.   LUNGS: Clear to auscultation bilaterally with no wheezing or rhonchi.   CARDIOVASCULAR: Regular rate and rhythm. No murmurs, rubs, or gallops.   ABDOMEN: Obese, nontender, and nondistended with good bowel sounds and no evidence of hepatosplenomegaly.   MUSCULOSKELETAL: Exam is grossly nonfocal. He is moving all extremities and has no joint effusions.   SKIN: Skin is warm and dry without rashes or lesions.   LYMPH: He has no cervical, axillary, inguinal, or supraclavicular lymphadenopathy.  NEURO: His neurologic exam is grossly nonfocal and he is alert and oriented to person, place, and time.   ADMISSION LABORATORY DATA: Sodium 141, potassium 3.7, chloride 103, bicarbonate 27, BUN 13, creatinine 1.33, glucose 124. Initial EKG showed supraventricular tachycardia with a rate in the 200s. Currently on cardiac monitor he is in normal sinus rhythm. Chest x-ray shows shallow inspirations and normal heart size.   ASSESSMENT AND PLAN:  1. Atrial fibrillation with rapid ventricular response status post cardioversion for failure to respond to adenosine. Given his initial episodes of severe hypotension, we will admit to telemetry floor for serial monitoring. We will cycle cardiac enzymes and have cardiology see him in the morning. We will continue metoprolol and flecainide for now. We will check a urine drug screen and TSH to look for other possible causes.  2. Diabetes mellitus. Controlled on oral metformin. We will check hemoglobin A1c and follow blood sugars.  3. Hypertension.  All antihypertensives will be on hold for now given his episode of severe hypotension.  4. Hyperlipidemia. Fasting lipids will be checked in the morning. Continue pravastatin.  5. History of atrial fibrillation. PT and INR have not been checked and he is on Coumadin 10 mg daily.  We will check that this evening and follow.  6. History of hematuria. This was attributed to his Coumadin therapy. He will get a repeat urinalysis while he is here and consider outpatient urologic work-up if positive.   ESTIMATED TIME OF CARE: 40 minutes.   ____________________________ Duncan Dulleresa Tullo, MD tt:bjt D: 11/18/2011 18:45:28 ET T: 11/19/2011 09:01:21 ET JOB#: [id number]  cc: Duncan Dulleresa Tullo, MD, <Dictator> Dr. Artist PaisYoo, [place], [place]   TERESA TULLO MD ELECTRONICALLY SIGNED 11/24/2011 18:22

## 2015-02-14 NOTE — Discharge Summary (Signed)
PATIENT NAME:  Steven Beltran, Steven MR#:  [id number] DATE OF BIRTH:  20-Nov-1955  DATE OF ADMISSION:  11/18/2011 DATE OF DISCHARGE:  11/19/2011  CONSULTATION: Cardiology consultation with Dr. Mariah MillingGollan    PRESENTING COMPLAINT: "My heart is racing".   DISCHARGE DIAGNOSES:  1. Atrial flutter 1:1, now resolved after cardioversion in the Emergency Room.  2. Obstructive sleep apnea, intolerant to CPAP.  3. History of atrial flutter/fibrillation, status post ablation in the past. Heart rate on discharge 50 to 54. Blood pressure is stable.   MEDICATIONS:  1. Metoprolol 25 mg half a tablet b.i.d.  2. Pravastatin daily.  3. Metformin ER 500 mg b.i.d.  4. Losartan 25 mg daily.  5. Coumadin 10 mg daily.  6. Amiodarone 400 mg b.i.d. for four days and then 200 mg p.o. b.i.d.   FOLLOW-UP: The patient will follow-up with Dr. Windell HummingbirdGollan's office next week. Dr. Windell HummingbirdGollan's office is to call for an appointment.   LABS ON DISCHARGE: Cardiac enzymes negative. CK total 240. Lipid profile within normal limits. Glucose 105, BUN 12, creatinine 1, sodium 147, chloride 109. Troponin 0.07. Urinalysis positive for benzodiazepines. Chest x-ray shallow respiration without evidence of acute cardiopulmonary disease. PT-INR 29.9 and 2.8. Hemoglobin A1c 6.5.   BRIEF SUMMARY OF HOSPITAL COURSE: Mr. Manson PasseyBrown is a 59 year old obese gentleman with past medical history of atrial flutter/fibrillation with ablation in the past who came in with:  1. Rapid heart rate. He was found to have atrial flutter 1:1 per Dr. Windell HummingbirdGollan's evaluation. Initially it was thought the patient had SVT, received a couple doses of adenosine in the Emergency Room to which the patient did not respond, hence, the patient was given some IV sedation and cardioversion was carried out by ER physician. The patient was admitted and thereafter placed on metoprolol and amiodarone per Dr. Windell HummingbirdGollan's recommendation. His flecainide was discontinued. Coumadin was continued. INR was therapeutic  at 2.9. The patient did not have any further episode of palpitations.  2. Type II diabetes, controlled on oral metformin.  3. Hypertension, remained stable. The patient is on losartan and metformin. 4. Hyperlipidemia. Pravastatin was continued.   DISPOSITION: The patient will follow-up with Dr. Mariah MillingGollan as outpatient.   Hospital stay otherwise remained stable.  CODE STATUS: He remained a FULL CODE.  TIME SPENT: 40 minutes.   ____________________________ Wylie HailSona A. Allena KatzPatel, MD sap:drc D: 11/25/2011 15:51:55 ET T: 11/27/2011 13:03:26 ET JOB#: [id number]  cc: Everet Flagg A. Allena KatzPatel, MD, <Dictator> Antonieta Ibaimothy J. Gollan, MD Willow OraSONA A Rahmel Nedved MD ELECTRONICALLY SIGNED 11/30/2011 7:40

## 2015-02-14 NOTE — Consult Note (Signed)
Chief Complaint:   Subjective/Chief Complaint RAPID HEART RATE   Assessment/Plan:  Assessment/Plan:   Assessment 59 yr old AA male with histoyr of altrila fibrillation, prior cardioablations, presesnts with sudden onset of rapid heart  rate found to be in SVT 206, s/p cardioversion for failutre to respond to adenosine x 3.  Initally quite hypotensive .   1) Atrial fib w RVR:  this is his third cardioablation in 2 years.  will consult [place] cardiology for inhouse discussion with patient about long term therapies.  Continue flecainide for now,  holidng metoprolol given soft bp.  checking TSH and UDS.  2) DM:  hgba1c due.  holding metformin pending any possible cardiac procedures.  3) Hyperlipidemia   Electronic Signatures: Duncan Dullullo, Tifanie Gardiner (MD)  (Signed 26-Jan-13 18:55)  Authored: Chief Complaint, Assessment/Plan   Last Updated: 26-Jan-13 18:55 by Duncan Dullullo, Marigold Mom (MD)

## 2017-01-14 ENCOUNTER — Encounter: Payer: Self-pay | Admitting: Emergency Medicine

## 2017-01-14 ENCOUNTER — Emergency Department
Admission: EM | Admit: 2017-01-14 | Discharge: 2017-01-14 | Disposition: A | Discharge status: Home / Self Care | Payer: PRIVATE HEALTH INSURANCE | Attending: Emergency Medicine | Admitting: Emergency Medicine

## 2017-01-14 ENCOUNTER — Emergency Department: Payer: PRIVATE HEALTH INSURANCE

## 2017-01-14 DIAGNOSIS — R002 Palpitations: Secondary | ICD-10-CM | POA: Diagnosis not present

## 2017-01-14 DIAGNOSIS — E119 Type 2 diabetes mellitus without complications: Secondary | ICD-10-CM | POA: Insufficient documentation

## 2017-01-14 DIAGNOSIS — Z7901 Long term (current) use of anticoagulants: Secondary | ICD-10-CM | POA: Insufficient documentation

## 2017-01-14 DIAGNOSIS — Z7984 Long term (current) use of oral hypoglycemic drugs: Secondary | ICD-10-CM | POA: Insufficient documentation

## 2017-01-14 DIAGNOSIS — I10 Essential (primary) hypertension: Secondary | ICD-10-CM | POA: Insufficient documentation

## 2017-01-14 HISTORY — DX: Unspecified atrial fibrillation: I48.91

## 2017-01-14 LAB — CBC
HCT: 44.4 % (ref 40.0–52.0)
Hemoglobin: 14.6 g/dL (ref 13.0–18.0)
MCH: 27 pg (ref 26.0–34.0)
MCHC: 32.8 g/dL (ref 32.0–36.0)
MCV: 82.3 fL (ref 80.0–100.0)
PLATELETS: 163 10*3/uL (ref 150–440)
RBC: 5.4 MIL/uL (ref 4.40–5.90)
RDW: 17.1 % — AB (ref 11.5–14.5)
WBC: 12.2 10*3/uL — ABNORMAL HIGH (ref 3.8–10.6)

## 2017-01-14 LAB — BASIC METABOLIC PANEL
Anion gap: 6 (ref 5–15)
BUN: 14 mg/dL (ref 6–20)
CALCIUM: 9 mg/dL (ref 8.9–10.3)
CO2: 27 mmol/L (ref 22–32)
CREATININE: 1.21 mg/dL (ref 0.61–1.24)
Chloride: 107 mmol/L (ref 101–111)
GFR calc Af Amer: >60 mL/min (ref >60)
GFR calc non Af Amer: >60 mL/min (ref >60)
GLUCOSE: 92 mg/dL (ref 65–99)
Potassium: 4 mmol/L (ref 3.5–5.1)
Sodium: 140 mmol/L (ref 135–145)

## 2017-01-14 LAB — TROPONIN I

## 2017-01-14 NOTE — ED Provider Notes (Signed)
[facility] Emergency Department Provider Note  ____________________________________________  Time seen: Approximately 3:43 PM  I have reviewed the triage vital signs and the nursing notes.   HISTORY  Chief Complaint Palpitations    HPI Steven Beltran is a 61 y.o. male who complains of a brief episode of palpitations and feeling like his heart was racing last night. Lasted for about an hour. He then went to bed early, and when he woke up in the morning it was completely resolved. He denies any chest pain or shortness of breath at all with it. He checked his apple watch and found his heart rate to be 110 during this episode. He has a history of paroxysmal atrial fibrillation. He is on amiodarone daily as well as aspirin and Eliquis. Light with all his medications. Today he is completely asymptomatic and wanted to be checked out to ensure that he does not need any acute intervention.  She is recently staying with his family members in [place]. He notes that they smoke heavily in the house and that he was exposed to a lot of cigarette smoke. Today he is driving back home to [facility].     Past Medical History:  Diagnosis Date  . Atrial fibrillation (HCC)   . Bradycardia    with chronotropic incompetence   . Dermatitis   . Diabetes type 2, controlled (HCC)    controlled - not specifed  . ED (erectile dysfunction)   . Hearing loss   . HTN (hypertension)   . Microscopic hematuria   . Obesity   . OSA (obstructive sleep apnea)    using CPAP more  . Paroxysmal atrial fibrillation (HCC)    cardioverted in 2008; been maintained on flecainide and lopressor.      Patient Active Problem List   Diagnosis Date Noted  . Hematuria, gross 09/13/2011  . Diabetes mellitus type II 07/11/2011  . Long term current use of anticoagulant 01/07/2011  . HYPERLIPIDEMIA-MIXED 04/28/2009  . MICROSCOPIC HEMATURIA 10/29/2008  . CHEST PAIN, ATYPICAL 10/29/2008  .  ABDOMINAL PAIN, LEFT UPPER QUADRANT 10/29/2008  . DIABETES MELLITUS, TYPE II, BORDERLINE 06/02/2008  . HEARING LOSS 02/24/2008  . DERMATITIS 02/24/2008  . MORBID OBESITY 10/02/2007  . OBSTRUCTIVE SLEEP APNEA 10/02/2007  . HYPERTENSION 10/02/2007  . ATRIAL FIBRILLATION, PAROXYSMAL 10/02/2007     Past Surgical History:  Procedure Laterality Date  . KNEE SURGERY  1971      Prior to Admission medications   Medication Sig Start Date End Date Taking? Authorizing Provider  amiodarone (PACERONE) 200 MG tablet Take 1 tablet (200 mg total) by mouth daily. 08/21/12   Antonieta Iba, MD  colchicine 0.6 MG tablet Take 1 tablet (0.6 mg total) by mouth daily as needed. 12/01/11 11/30/12  Doe-Hyun R Artist Pais, DO  losartan (COZAAR) 25 MG tablet Take 1 tablet (25 mg total) by mouth daily. 01/17/12   Antonieta Iba, MD  metFORMIN (GLUCOPHAGE) 500 MG tablet TAKE 1.5 TABLETS (750 MG TOTAL) BY MOUTH 2 (TWO) TIMES DAILY WITH A MEAL. 10/11/12   Doe-Hyun R Artist Pais, DO  metoprolol tartrate (LOPRESSOR) 25 MG tablet Take 0.5 tablets (12.5 mg total) by mouth 2 (two) times daily. 05/07/12 05/07/13  Antonieta Iba, MD  pravastatin (PRAVACHOL) 40 MG tablet TAKE 1 TABLET (40 MG TOTAL) BY MOUTH DAILY. 04/07/12   Laurey Morale, MD  tadalafil (CIALIS) 5 MG tablet Take 5 mg by mouth daily as needed. 30 min prior to activity     Historical  Provider, MD  warfarin (COUMADIN) 10 MG tablet Take 1 tablet (10 mg total) by mouth daily. 07/12/12   Antonieta Ibaimothy J Gollan, MD     Allergies Shellfish allergy   Family History  Problem Relation Age of Onset  . Colon cancer Neg Hx   . Diabetes type II Neg Hx     Social History Social History  Substance Use Topics  . Smoking status: Never Smoker  . Smokeless tobacco: Never Used  . Alcohol use Yes     Comment: seldom     Review of Systems  Constitutional:   No fever or chills.  ENT:   No sore throat. No rhinorrhea. Cardiovascular:   No chest pain.Positive palpitations Respiratory:    No dyspnea or cough. Gastrointestinal:   Negative for abdominal pain, vomiting and diarrhea.  Genitourinary:   Negative for dysuria or difficulty urinating. Musculoskeletal:   Negative for focal pain or swelling Neurological:   Negative for headaches 10-point ROS otherwise negative.  ____________________________________________   PHYSICAL EXAM:  VITAL SIGNS: ED Triage Vitals  Enc Vitals Group     BP 01/14/17 1341 134/86     Pulse Rate 01/14/17 1341 63     Resp 01/14/17 1341 20     Temp 01/14/17 1341 97.9 F (36.6 C)     Temp Source 01/14/17 1341 Oral     SpO2 01/14/17 1341 99 %     Weight 01/14/17 1336 (!) 352 lb (159.7 kg)     Height 01/14/17 1336 6\' 2"  (1.88 m)     Head Circumference --      Peak Flow --      Pain Score --      Pain Loc --      Pain Edu? --      Excl. in GC? --     Vital signs reviewed, nursing assessments reviewed.   Constitutional:   Alert and oriented. Well appearing and in no distress.Morbidly obese Eyes:   No scleral icterus. No conjunctival pallor. PERRL. EOMI.  No nystagmus. ENT   Head:   Normocephalic and atraumatic.   Nose:   No congestion/rhinnorhea. No septal hematoma   Mouth/Throat:   MMM, no pharyngeal erythema. No peritonsillar mass.    Neck:   No stridor. No SubQ emphysema. No meningismus. Hematological/Lymphatic/Immunilogical:   No cervical lymphadenopathy. Cardiovascular:   RRR. Symmetric bilateral radial and DP pulses.  No murmurs. No peripheral pulse delay  Respiratory:   Normal respiratory effort without tachypnea nor retractions. Breath sounds are clear and equal bilaterally. No wheezes/rales/rhonchi. Gastrointestinal:   Soft and nontender. Non distended. There is no CVA tenderness.  No rebound, rigidity, or guarding. Genitourinary:   deferred Musculoskeletal:   Normal range of motion in all extremities. No joint effusions.  No lower extremity tenderness.  No edema. Neurologic:   Normal speech and language.  CN  2-10 normal. Motor grossly intact. No gross focal neurologic deficits are appreciated.  Skin:    Skin is warm, dry and intact. No rash noted.  No petechiae, purpura, or bullae.  ____________________________________________    LABS (pertinent positives/negatives) (all labs ordered are listed, but only abnormal results are displayed) Labs Reviewed  CBC - Abnormal; Notable for the following:       Result Value   WBC 12.2 (*)    RDW 17.1 (*)    All other components within normal limits  BASIC METABOLIC PANEL  TROPONIN I   ____________________________________________   EKG  intepreted by me Sinus brady rate 59, left  axis, 1st degree AV block, PR  . Normal QRS/ST/T.  ____________________________________________    RADIOLOGY  Dg Chest 2 View  Result Date: 01/14/2017 CLINICAL DATA:  Atrial fibrillation, palpitations EXAM: CHEST  2 VIEW COMPARISON:  11/18/2011 FINDINGS: Cardiomediastinal silhouette is stable. Mild elevation of the right hemidiaphragm. No infiltrate or pleural effusion. No pulmonary edema. Degenerative changes mid and lower thoracic spine. IMPRESSION: No active cardiopulmonary disease. Electronically Signed   By: Natasha Mead M.D.   On: 01/14/2017 14:28    ____________________________________________   PROCEDURES Procedures  ____________________________________________   INITIAL IMPRESSION / ASSESSMENT AND PLAN / ED COURSE  Pertinent labs & imaging results that were available during my care of the patient were reviewed by me and considered in my medical decision making (see chart for details).  Patient presents with an episode of palpitations last night, possibly due to paroxysmal atrial fibrillation. Patient is on daily amiodarone which provides rate and rhythm control and has him back in a sinus rhythm today on evaluation in the ED. He is also on aspirin and Eliquis, protected from potentially thrombogenic effects of A. fib. Currently asymptomatic. Even  though he is currently in the midst of travel, he is low risk for PE given his atypical symptoms, lack of chest pain or shortness of breath, normal vital signs, and compliance with Eliquis.Considering the patient's symptoms, medical history, and physical examination today, I have low suspicion for ACS, PE, TAD, pneumothorax, carditis, mediastinitis, pneumonia, CHF, or sepsis. No evidence of acidosis or electrolyte abnormality. No recent viral illness.           ____________________________________________   FINAL CLINICAL IMPRESSION(S) / ED DIAGNOSES  Final diagnoses:  Palpitations      New Prescriptions   No medications on file     Portions of this note were generated with dragon dictation software. Dictation errors may occur despite best attempts at proofreading.    Sharman Cheek, MD 01/14/17 [phone]

## 2017-01-14 NOTE — ED Triage Notes (Signed)
Pt in via POV with complaints of palpitations and "feeling like my heart is racing."  Pt denies any chest pain, denies any shortness of breath.  Pt with hx of afibb.  Pt with recent travel.  NAD noted at this time.

## 2017-01-14 NOTE — Discharge Instructions (Signed)
If you continue to have palpitation symptoms and episodes of fast heart rate, follow up with your cardiologist for further evaluation of your symptoms.

## 2017-11-10 IMAGING — CR DG CHEST 2V
2 series · 2 of 2 positions shown · non-contrast
Comparison: 11/18/2011

CLINICAL DATA: Atrial fibrillation, palpitations

EXAM:
CHEST  2 VIEW

[chest lat]
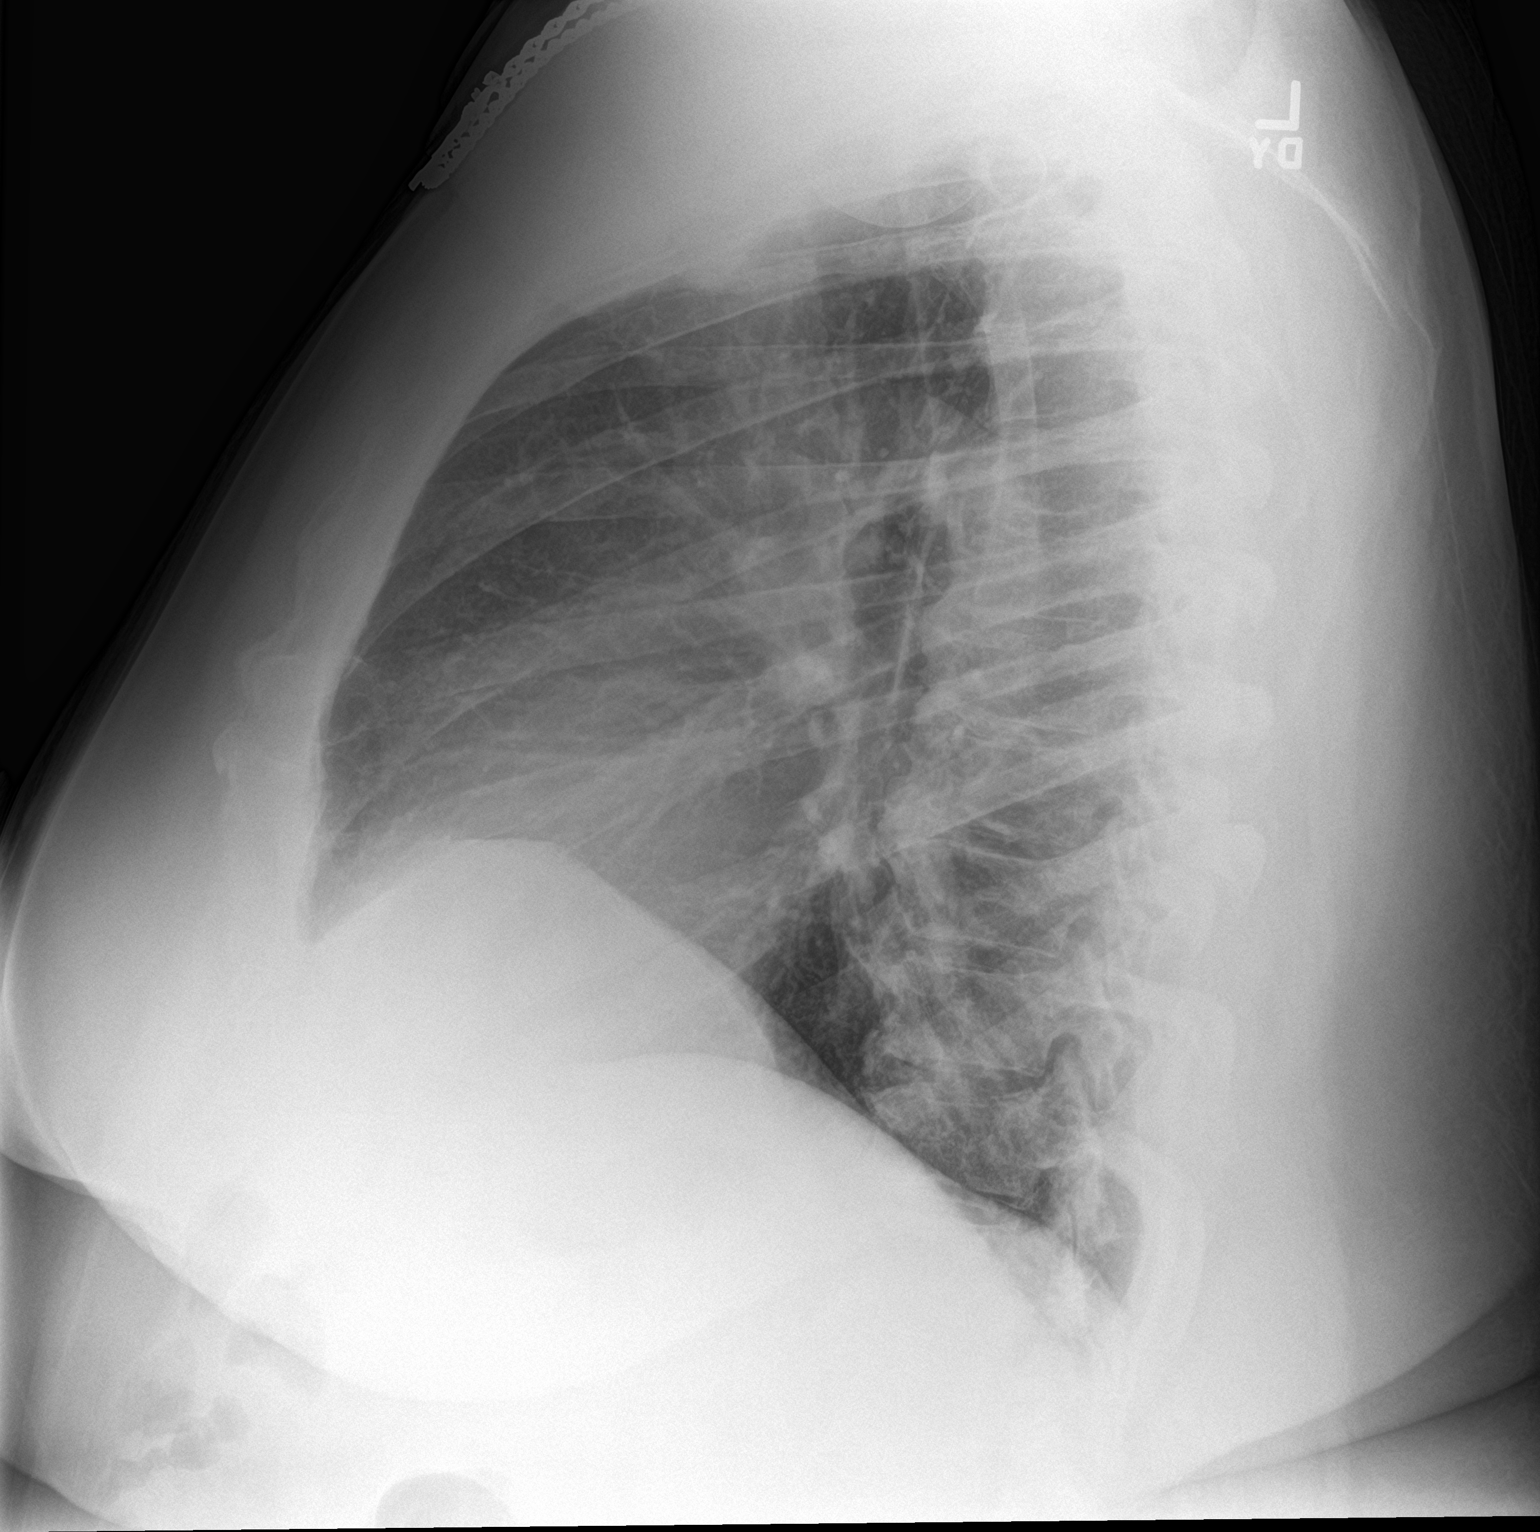

[chest pa]
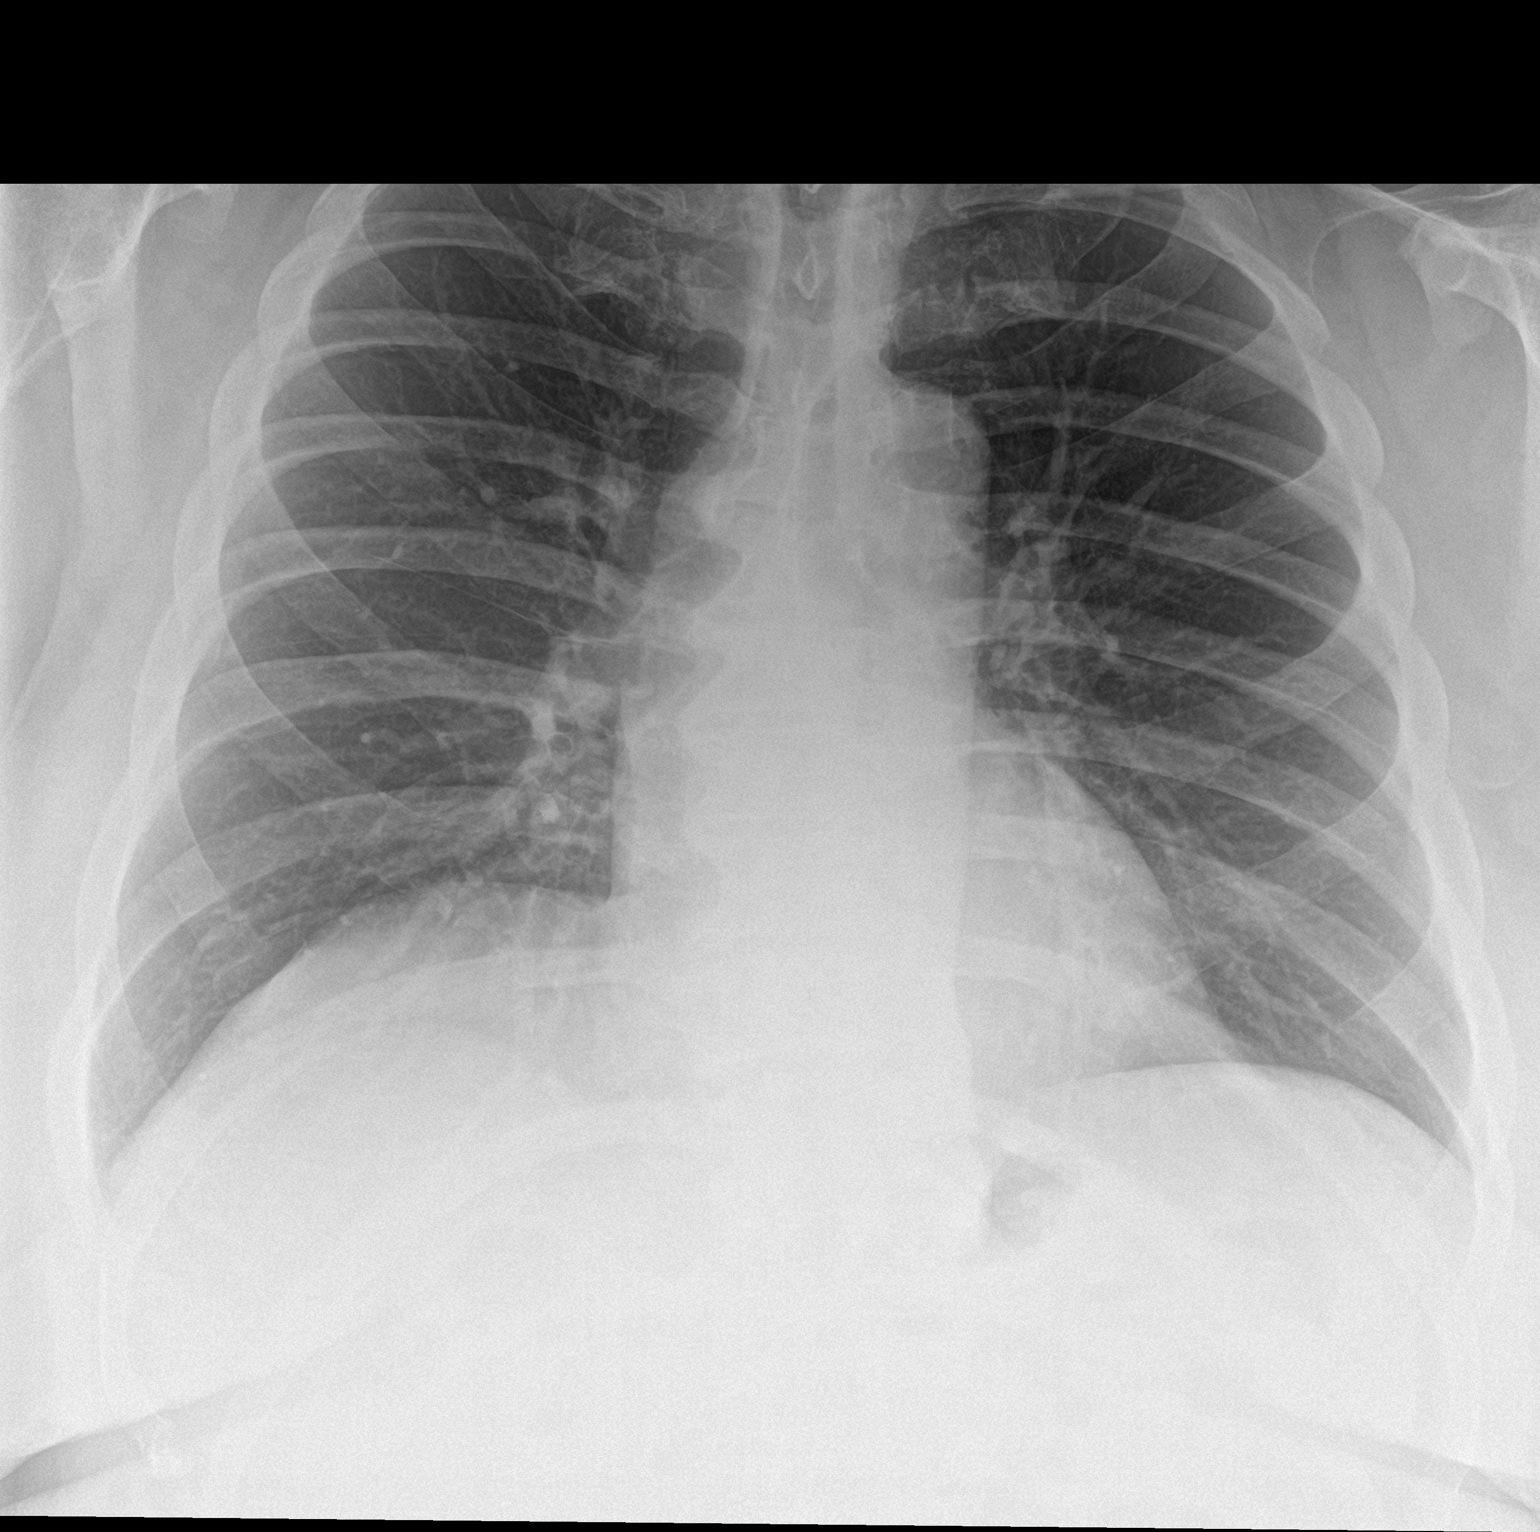

[2 of 2 positions shown; findings below may reference images not displayed]

FINDINGS: Cardiomediastinal silhouette is stable. Mild elevation of the right
hemidiaphragm. No infiltrate or pleural effusion. No pulmonary
edema. Degenerative changes mid and lower thoracic spine.
IMPRESSION: No active cardiopulmonary disease.
